# Patient Record
Sex: Female | Born: 1955 | Race: White | Hispanic: No | Marital: Married | Smoking: Former smoker
Health system: Southern US, Community
[De-identification: ages and names within clinical notes are randomized; demographics above are authoritative.]

## PROBLEM LIST (undated history)

## (undated) DIAGNOSIS — M546 Pain in thoracic spine: Secondary | ICD-10-CM

## (undated) DIAGNOSIS — D729 Disorder of white blood cells, unspecified: Secondary | ICD-10-CM

## (undated) DIAGNOSIS — R011 Cardiac murmur, unspecified: Secondary | ICD-10-CM

## (undated) DIAGNOSIS — E7800 Pure hypercholesterolemia, unspecified: Secondary | ICD-10-CM

## (undated) DIAGNOSIS — S2500XA Unspecified injury of thoracic aorta, initial encounter: Secondary | ICD-10-CM

## (undated) DIAGNOSIS — K760 Fatty (change of) liver, not elsewhere classified: Secondary | ICD-10-CM

## (undated) DIAGNOSIS — Z803 Family history of malignant neoplasm of breast: Secondary | ICD-10-CM

## (undated) DIAGNOSIS — D72829 Elevated white blood cell count, unspecified: Secondary | ICD-10-CM

## (undated) DIAGNOSIS — K5900 Constipation, unspecified: Secondary | ICD-10-CM

## (undated) DIAGNOSIS — D649 Anemia, unspecified: Secondary | ICD-10-CM

## (undated) DIAGNOSIS — F419 Anxiety disorder, unspecified: Secondary | ICD-10-CM

## (undated) DIAGNOSIS — R700 Elevated erythrocyte sedimentation rate: Secondary | ICD-10-CM

## (undated) DIAGNOSIS — K219 Gastro-esophageal reflux disease without esophagitis: Secondary | ICD-10-CM

## (undated) DIAGNOSIS — R899 Unspecified abnormal finding in specimens from other organs, systems and tissues: Secondary | ICD-10-CM

## (undated) DIAGNOSIS — I10 Essential (primary) hypertension: Secondary | ICD-10-CM

## (undated) DIAGNOSIS — M1990 Unspecified osteoarthritis, unspecified site: Secondary | ICD-10-CM

## (undated) HISTORY — DX: Unspecified abnormal finding in specimens from other organs, systems and tissues: R89.9

## (undated) HISTORY — DX: Unspecified osteoarthritis, unspecified site: M19.90

## (undated) HISTORY — DX: Constipation, unspecified: K59.00

## (undated) HISTORY — DX: Essential (primary) hypertension: I10

## (undated) HISTORY — DX: Gastro-esophageal reflux disease without esophagitis: K21.9

## (undated) HISTORY — DX: Cardiac murmur, unspecified: R01.1

## (undated) HISTORY — PX: CHOLECYSTECTOMY: SHX55

## (undated) HISTORY — DX: Unspecified injury of thoracic aorta, initial encounter: S25.00XA

## (undated) HISTORY — DX: Fatty (change of) liver, not elsewhere classified: K76.0

## (undated) HISTORY — DX: Elevated erythrocyte sedimentation rate: R70.0

## (undated) HISTORY — DX: Anemia, unspecified: D64.9

## (undated) HISTORY — DX: Anxiety disorder, unspecified: F41.9

## (undated) HISTORY — DX: Family history of malignant neoplasm of breast: Z80.3

## (undated) HISTORY — DX: Pain in thoracic spine: M54.6

## (undated) HISTORY — DX: Elevated white blood cell count, unspecified: D72.829

## (undated) HISTORY — DX: Disorder of white blood cells, unspecified: D72.9

## (undated) HISTORY — DX: Pure hypercholesterolemia, unspecified: E78.00

---

## 1998-10-10 HISTORY — PX: ABDOMINAL HYSTERECTOMY: SHX81

## 2002-11-20 ENCOUNTER — Encounter: Payer: Self-pay | Admitting: Gastroenterology

## 2002-11-20 ENCOUNTER — Encounter: Admission: RE | Admit: 2002-11-20 | Discharge: 2002-11-20 | Payer: Self-pay | Admitting: Gastroenterology

## 2011-08-11 ENCOUNTER — Inpatient Hospital Stay (HOSPITAL_COMMUNITY)
Admission: EM | Admit: 2011-08-11 | Discharge: 2011-08-14 | DRG: 547 | Disposition: A | Discharge status: Home / Self Care | Payer: PRIVATE HEALTH INSURANCE | Attending: Internal Medicine | Admitting: Internal Medicine

## 2011-08-11 ENCOUNTER — Ambulatory Visit
Admission: RE | Admit: 2011-08-11 | Discharge: 2011-08-11 | Disposition: A | Discharge status: Home / Self Care | Payer: PRIVATE HEALTH INSURANCE | Source: Ambulatory Visit | Attending: Family Medicine | Admitting: Family Medicine

## 2011-08-11 ENCOUNTER — Other Ambulatory Visit: Payer: Self-pay | Admitting: Family Medicine

## 2011-08-11 DIAGNOSIS — M4120 Other idiopathic scoliosis, site unspecified: Secondary | ICD-10-CM | POA: Diagnosis present

## 2011-08-11 DIAGNOSIS — R05 Cough: Secondary | ICD-10-CM

## 2011-08-11 DIAGNOSIS — R319 Hematuria, unspecified: Secondary | ICD-10-CM | POA: Diagnosis present

## 2011-08-11 DIAGNOSIS — I776 Arteritis, unspecified: Principal | ICD-10-CM | POA: Diagnosis present

## 2011-08-11 DIAGNOSIS — D649 Anemia, unspecified: Secondary | ICD-10-CM | POA: Diagnosis present

## 2011-08-11 DIAGNOSIS — K219 Gastro-esophageal reflux disease without esophagitis: Secondary | ICD-10-CM | POA: Diagnosis present

## 2011-08-11 DIAGNOSIS — I7101 Dissection of thoracic aorta: Secondary | ICD-10-CM

## 2011-08-11 DIAGNOSIS — E876 Hypokalemia: Secondary | ICD-10-CM | POA: Diagnosis present

## 2011-08-11 DIAGNOSIS — R911 Solitary pulmonary nodule: Secondary | ICD-10-CM | POA: Diagnosis present

## 2011-08-11 LAB — DIFFERENTIAL
Basophils Absolute: 0 10*3/uL (ref 0.0–0.1)
Basophils Relative: 0 % (ref 0–1)
Eosinophils Absolute: 0 10*3/uL (ref 0.0–0.7)
Eosinophils Relative: 0 % (ref 0–5)
Lymphocytes Relative: 11 % — ABNORMAL LOW (ref 12–46)
Lymphs Abs: 1.4 10*3/uL (ref 0.7–4.0)
Monocytes Absolute: 1.1 10*3/uL — ABNORMAL HIGH (ref 0.1–1.0)
Monocytes Relative: 8 % (ref 3–12)
Neutro Abs: 10.6 10*3/uL — ABNORMAL HIGH (ref 1.7–7.7)
Neutrophils Relative %: 80 % — ABNORMAL HIGH (ref 43–77)

## 2011-08-11 LAB — CBC
HCT: 27.9 % — ABNORMAL LOW (ref 36.0–46.0)
Hemoglobin: 9.6 g/dL — ABNORMAL LOW (ref 12.0–15.0)
MCH: 31.1 pg (ref 26.0–34.0)
MCHC: 34.4 g/dL (ref 30.0–36.0)
MCV: 90.3 fL (ref 78.0–100.0)
Platelets: 639 10*3/uL — ABNORMAL HIGH (ref 150–400)
RBC: 3.09 MIL/uL — ABNORMAL LOW (ref 3.87–5.11)
RDW: 11.8 % (ref 11.5–15.5)
WBC: 13.2 K/uL — ABNORMAL HIGH (ref 4.0–10.5)

## 2011-08-11 LAB — BASIC METABOLIC PANEL WITH GFR
Calcium: 9.5 mg/dL (ref 8.4–10.5)
Creatinine, Ser: 0.55 mg/dL (ref 0.50–1.10)
GFR calc Af Amer: >90 mL/min (ref >90)
GFR calc non Af Amer: >90 mL/min (ref >90)
Sodium: 136 meq/L (ref 135–145)

## 2011-08-11 LAB — POCT I-STAT, CHEM 8
BUN: 4 mg/dL — ABNORMAL LOW (ref 6–23)
Calcium, Ion: 1.06 mmol/L — ABNORMAL LOW (ref 1.12–1.32)
Chloride: 97 mEq/L (ref 96–112)
Creatinine, Ser: 0.7 mg/dL (ref 0.50–1.10)
Glucose, Bld: 130 mg/dL — ABNORMAL HIGH (ref 70–99)
HCT: 35 % — ABNORMAL LOW (ref 36.0–46.0)
Hemoglobin: 11.9 g/dL — ABNORMAL LOW (ref 12.0–15.0)
Potassium: 2.6 mEq/L — CL (ref 3.5–5.1)
Sodium: 137 mEq/L (ref 135–145)
TCO2: 27 mmol/L (ref 0–100)

## 2011-08-11 LAB — SAMPLE TO BLOOD BANK

## 2011-08-11 LAB — BASIC METABOLIC PANEL
BUN: 6 mg/dL (ref 6–23)
CO2: 28 mEq/L (ref 19–32)
Chloride: 94 mEq/L — ABNORMAL LOW (ref 96–112)
Glucose, Bld: 128 mg/dL — ABNORMAL HIGH (ref 70–99)
Potassium: 2.6 mEq/L — CL (ref 3.5–5.1)

## 2011-08-11 LAB — CK TOTAL AND CKMB (NOT AT ARMC)
CK, MB: 1.4 ng/mL (ref 0.3–4.0)
Total CK: 29 U/L (ref 7–177)

## 2011-08-11 LAB — PROTIME-INR
INR: 1.24 (ref 0.00–1.49)
Prothrombin Time: 15.9 s — ABNORMAL HIGH (ref 11.6–15.2)

## 2011-08-11 LAB — APTT: aPTT: 35 s (ref 24–37)

## 2011-08-11 MED ORDER — IOHEXOL 300 MG/ML  SOLN
125.0000 mL | Freq: Once | INTRAMUSCULAR | Status: AC | PRN
  Administered 2011-08-11: 125 mL via INTRAVENOUS

## 2011-08-12 LAB — IRON AND TIBC
Iron: 10 ug/dL — ABNORMAL LOW (ref 42–135)
UIBC: 169 ug/dL (ref 125–400)

## 2011-08-12 LAB — CARDIAC PANEL(CRET KIN+CKTOT+MB+TROPI)
CK, MB: 1.3 ng/mL (ref 0.3–4.0)
CK, MB: 1.2 ng/mL (ref 0.3–4.0)
Troponin I: <0.3 ng/mL (ref <0.30)

## 2011-08-12 LAB — FERRITIN: Ferritin: 1038 ng/mL — ABNORMAL HIGH (ref 10–291)

## 2011-08-12 LAB — MAGNESIUM: Magnesium: 2.2 mg/dL (ref 1.5–2.5)

## 2011-08-12 NOTE — H&P (Signed)
Alexandra Jordan, Alexandra Jordan NO.:  [id number]  MEDICAL RECORD NO.:  [id number]  LOCATION:  [facility]                         FACILITY:  [facility]  PHYSICIAN:  Houston Siren, MD           DATE OF BIRTH:  1955-10-23  DATE OF ADMISSION:  08/11/2011 DATE OF DISCHARGE:                             HISTORY & PHYSICAL   PRIMARY CARE PHYSICIAN:  Dr. Tenny Craw.  ADVANCED DIRECTIVES:  Full code.  REASON FOR ADMISSION:  Possible descending aortic dissection and anemia.  HISTORY OF PRESENT ILLNESS:  This is a 55 year old female with benign past medical history on no chronic medication, presents to the emergency room with chest pain and back pain.  She had back pain and chest pain for about a week and was diagnosed with musculoskeletal strain, treated with muscle relaxers and nonsteroidal anti-inflammatory drugs, but reported no improvement.  Her primary care physician ordered a CT pulmonary angiogram mainly looking for a PE, which was negative, but found to have a question of mural thrombus in the descending aorta along with a very small (0.6 cm x 0.6 cm) pulmonary nodule.  She was subsequently sent to the emergency room.  Workup is also significant for an anemia with a hemoglobin of 9.6.  It should be noted that she is status post hysterectomy, and she did have colonoscopy several years ago which reportedly was negative.  Her EKG showed normal sinus rhythm without any acute ST changes.  Consultation with Dr. Orpah Greek of vascular surgery who recommended observation admission and to treat her blood pressure more aggressively.  I asked him whether or not we should consider heparin and he recommended not to start her on any heparin.  She is currently comfortable, but is quite anxious.  She is not sure about her prior history of anemia and she could not elaborate.  REVIEW OF SYSTEMS:  Otherwise unremarkable.  CURRENT MEDICATIONS:  Tramadol, muscle relaxant, and Prilosec.  ALLERGIES:  CELEBREX,  causes hives.  FAMILY HISTORY:  No hematological problem but her mother had breast cancer.  SOCIAL HISTORY:  She previously worked for [employer].  She lives with her spouse and has 3 children.  Currently, she is a housewife.  She denied tobacco, alcohol, or drug use.  PHYSICAL EXAM:  VITAL SIGNS:  Blood pressure is 130/86, pulse of about 100, respirations 16, temperature 98.4. GENERAL:  She is noted to be in apparent distress. EYES:  Sclerae are nonicteric. NECK:  Supple.  Throat is clear.  No carotid bruit. CARDIAC:  S1, S2 regular.  I did not hear any murmur, rub, or gallop. LUNGS;  clear.  No wheezes, rales, or any evidence of consolidation. ABDOMEN:  Soft, nondistended, nontender. EXTREMITIES:  No edema.  She has good distal pulses bilaterally.  She has no abdominal bruit. SKIN:  Warm and dry.  No peripheral sign of shock. NEUROLOGIC:  Unremarkable. PSYCHIATRIC:  Unremarkable.  OBJECTIVE FINDINGS:  CT of the chest shows 0.6 cm pulmonary nodule and a density in the descending aorta suspicious for mural thrombus.  White count of 13.2000, hemoglobin of 9.6, MCV of 90, platelet count 639,000. INR of 1.24.  Serum sodium 136, potassium 2.6, creatinine 0.55,  glucose 128.  EKG showed normal sinus rhythm without any acute ST changes.  IMPRESSION:  This is a 55 year old female with possible descending aortic dissection.  We will admit her to telemetry.  We will rule out with CPKs and troponins.  Vascular surgeon Dr. Orpah Greek will see her first thing in the morning.  I will start her on beta blocker and give her pain medication. He recommended NO IV heparin.  For her anemia, we will do a full workup. This will include  reticulocyte count, iron studies, B12, stool guaiac,etc.  She also has low potassium and will get both IV runs along with daily supplements for probably a couple of months.  Her total K deficit is estimated at about 400 to 600 milliEqs total. We will  replete her potassium and  I would like to check a cortisol level along with a Mag level. She is stable, full code, will be admitted to team 5.     Houston Siren, MD     PL/MEDQ  D:  08/11/2011  T:  08/11/2011  Job:  [id number]  Electronically Signed by Houston Siren  on 08/12/2011 04:01:30 AM

## 2011-08-13 ENCOUNTER — Inpatient Hospital Stay (HOSPITAL_COMMUNITY): Payer: PRIVATE HEALTH INSURANCE

## 2011-08-13 ENCOUNTER — Encounter (HOSPITAL_COMMUNITY): Payer: Self-pay | Admitting: Radiology

## 2011-08-13 LAB — BASIC METABOLIC PANEL
Chloride: 102 mEq/L (ref 96–112)
Creatinine, Ser: 0.58 mg/dL (ref 0.50–1.10)
GFR calc Af Amer: >90 mL/min (ref >90)
GFR calc non Af Amer: >90 mL/min (ref >90)
Potassium: 3.8 mEq/L (ref 3.5–5.1)

## 2011-08-13 LAB — CBC
Hemoglobin: 9.8 g/dL — ABNORMAL LOW (ref 12.0–15.0)
MCH: 30.6 pg (ref 26.0–34.0)
MCHC: 33 g/dL (ref 30.0–36.0)
Platelets: 486 10*3/uL — ABNORMAL HIGH (ref 150–400)
RBC: 3.2 MIL/uL — ABNORMAL LOW (ref 3.87–5.11)

## 2011-08-13 LAB — MAGNESIUM: Magnesium: 2.3 mg/dL (ref 1.5–2.5)

## 2011-08-13 LAB — CORTISOL-AM, BLOOD: Cortisol - AM: 13.5 ug/dL (ref 4.3–22.4)

## 2011-08-13 LAB — OCCULT BLOOD X 1 CARD TO LAB, STOOL: Fecal Occult Bld: NEGATIVE

## 2011-08-13 MED ORDER — HYDROMORPHONE HCL PF 1 MG/ML IJ SOLN: 0.5000 mg | INTRAMUSCULAR | Status: DC | PRN

## 2011-08-13 MED ORDER — TRAMADOL HCL 50 MG PO TABS: 50.0000 mg | ORAL_TABLET | Freq: Two times a day (BID) | ORAL | Status: DC | PRN

## 2011-08-13 MED ORDER — SODIUM CHLORIDE 0.9 % IJ SOLN
3.0000 mL | Freq: Two times a day (BID) | INTRAMUSCULAR | Status: DC
  Administered 2011-08-13: 3 mL via INTRAVENOUS

## 2011-08-13 MED ORDER — ACETAMINOPHEN 325 MG PO TABS: 650.0000 mg | ORAL_TABLET | ORAL | Status: DC | PRN

## 2011-08-13 MED ORDER — IOHEXOL 350 MG/ML SOLN
80.0000 mL | Freq: Once | INTRAVENOUS | Status: AC | PRN
  Administered 2011-08-13: 80 mL via INTRAVENOUS

## 2011-08-13 MED ORDER — LORAZEPAM 1 MG PO TABS
1.0000 mg | ORAL_TABLET | Freq: Three times a day (TID) | ORAL | Status: DC
  Administered 2011-08-13: 1 mg via ORAL

## 2011-08-13 MED ORDER — METOPROLOL TARTRATE 25 MG PO TABS
25.0000 mg | ORAL_TABLET | Freq: Two times a day (BID) | ORAL | Status: DC
  Filled 2011-08-13 (×4): qty 1

## 2011-08-13 MED ORDER — PANTOPRAZOLE SODIUM 40 MG PO TBEC: 40.0000 mg | DELAYED_RELEASE_TABLET | Freq: Every day | ORAL | Status: DC

## 2011-08-13 MED ORDER — POTASSIUM CHLORIDE CRYS ER 20 MEQ PO TBCR
20.0000 meq | EXTENDED_RELEASE_TABLET | Freq: Every day | ORAL | Status: DC
  Administered 2011-08-14: 20 meq via ORAL

## 2011-08-13 MED ORDER — ASPIRIN EC 81 MG PO TBEC
81.0000 mg | DELAYED_RELEASE_TABLET | Freq: Every day | ORAL | Status: DC
  Administered 2011-08-14: 81 mg via ORAL
  Filled 2011-08-13: qty 1

## 2011-08-14 DIAGNOSIS — D649 Anemia, unspecified: Secondary | ICD-10-CM | POA: Diagnosis present

## 2011-08-14 DIAGNOSIS — E876 Hypokalemia: Secondary | ICD-10-CM | POA: Diagnosis present

## 2011-08-14 DIAGNOSIS — R319 Hematuria, unspecified: Secondary | ICD-10-CM | POA: Diagnosis present

## 2011-08-14 DIAGNOSIS — I776 Arteritis, unspecified: Secondary | ICD-10-CM | POA: Diagnosis present

## 2011-08-14 LAB — COMPREHENSIVE METABOLIC PANEL
ALT: 22 U/L (ref 0–35)
AST: 18 U/L (ref 0–37)
Albumin: 2.6 g/dL — ABNORMAL LOW (ref 3.5–5.2)
Alkaline Phosphatase: 110 U/L (ref 39–117)
CO2: 27 mEq/L (ref 19–32)
Chloride: 104 mEq/L (ref 96–112)
Creatinine, Ser: 0.67 mg/dL (ref 0.50–1.10)
GFR calc non Af Amer: >90 mL/min (ref >90)
Potassium: 4 mEq/L (ref 3.5–5.1)
Sodium: 140 mEq/L (ref 135–145)
Total Bilirubin: 0.2 mg/dL — ABNORMAL LOW (ref 0.3–1.2)

## 2011-08-14 LAB — DIFFERENTIAL
Basophils Absolute: 0 10*3/uL (ref 0.0–0.1)
Basophils Relative: 0 % (ref 0–1)
Lymphocytes Relative: 16 % (ref 12–46)
Monocytes Absolute: 0.8 10*3/uL (ref 0.1–1.0)
Neutro Abs: 6.6 10*3/uL (ref 1.7–7.7)
Neutrophils Relative %: 73 % (ref 43–77)

## 2011-08-14 LAB — URINALYSIS, ROUTINE W REFLEX MICROSCOPIC
Bilirubin Urine: NEGATIVE
Glucose, UA: NEGATIVE mg/dL
Ketones, ur: NEGATIVE mg/dL
Protein, ur: NEGATIVE mg/dL
Urobilinogen, UA: 0.2 mg/dL (ref 0.0–1.0)

## 2011-08-14 LAB — CBC
HCT: 28.8 % — ABNORMAL LOW (ref 36.0–46.0)
MCHC: 32.3 g/dL (ref 30.0–36.0)
RDW: 12 % (ref 11.5–15.5)
WBC: 9.1 10*3/uL (ref 4.0–10.5)

## 2011-08-14 LAB — URINE MICROSCOPIC-ADD ON

## 2011-08-14 MED ORDER — ASPIRIN 81 MG PO TBEC: 81.0000 mg | DELAYED_RELEASE_TABLET | Freq: Every day | ORAL | Status: AC

## 2011-08-14 NOTE — Progress Notes (Signed)
Pt. And husband given d/c information...reviewed with both. Both verbalized understanding. No Rx's to be given. Pt. Escorted to private vehicle via W/C.

## 2011-08-14 NOTE — Discharge Summary (Signed)
Patient ID: Alexandra Jordan MRN: [id number] DOB/AGE: 10-Nov-1955 55 y.o.  Admit date: 08/11/2011 Discharge date: 08/14/2011  Primary Care Physician:  Daisy Floro, MD   Discharge Diagnoses:    Present on Admission:  .Aortitis/vasculitis  .Hypokalemia .Anemia .Lung nodule .Hematuria  Current Discharge Medication List  START taking these medications  Details aspirin EC 81 MG EC tablet Take 1 tablet (81 mg total) by mouth daily. Qty: 30 tablet, Refills: 0   CONTINUE these medications which have NOT CHANGED  Details metaxalone (SKELAXIN) 800 MG tablet Take 800 mg by mouth 3 (three) times daily.    omeprazole (PRILOSEC) 20 MG capsule Take 20 mg by mouth daily.    traMADol (ULTRAM) 50 MG tablet Take 50-100 mg by mouth every 12 (twelve) hours as needed. Maximum dose= 8 tablets per day  ** For pain**       Consults: Vascular surgery   Significant Diagnostic Studies:  CTA chest 08/11/11  *RADIOLOGY REPORT*  IMPRESSION:  1. Prominent abnormal soft tissue density around the mid  descending thoracic aorta is concerning for intramural hematoma in  this patient with chest pain. Emergent vascular surgical  consultation is recommended.  2. 6 mm right upper lobe pulmonary nodule. If the patient is at  high risk for bronchogenic carcinoma, follow-up chest CT at 6-12  months is recommended. If the patient is at low risk for  bronchogenic carcinoma, follow-up chest CT at 12 months is  recommended. This recommendation follows the consensus statement:  Guidelines for Management of Small Pulmonary Nodules Detected on CT  Scans: A Statement from the Fleischner Society as published in  Radiology 2005; 237:395-400. Online at:  DietDisorder.cz.     CTA chest 08/13/11 IMPRESSION:  stable descending thoracic aortic hypodense wall thickening without  associated aneurysm or dissection. Appearance is consistent with  previous intramural  hematoma and other considerations would include  aortitis/vasculitis.  Stable 6 mm right upper lobe pulmonary nodule as previously  described.  Basilar atelectasis and small left pleural effusion     CTA abdomen/pelvis11/3/12 IMPRESSION:  Proximal abdominal aortic wall thickening to a lesser degree.  There is also associated soft tissue thickening around the celiac  and SMA origins. Findings remain nonspecific but appearance is  more consistent with aortitis/vasculitis rather than acute  intramural hemorrhage. No associated dissection, aneurysm,  pseudoaneurysm or retroperitoneal acute hemorrhage.  No aortoiliac occlusive process.  Iliac vessels and proximal femoral vessels are not involved.  Previous cholecystectomy  No acute intra-abdominal or pelvic process.  Original Report Authenticated By: Judie Petit. Ruel Favors, M.D.    Brief H and P:  This is a 55 year old female with benign past medical history on no chronic medication, presents to the emergency room with chest pain and back pain. She had back pain and chest pain  for about a week and was diagnosed with musculoskeletal strain, treated  with muscle relaxers and nonsteroidal anti-inflammatory drugs, but reported no improvement. Her primary care physician ordered a CT pulmonary  angiogram mainly looking for a PE, which was negative, but found to have a  question of mural thrombus in the descending aorta along with a very  small (0.6 cm x 0.6 cm) pulmonary nodule. She was subsequently sent to  the emergency room. Workup is also significant for an anemia with a  hemoglobin of 9.6. It should be noted that she is status post  hysterectomy, and she did have colonoscopy several years ago which  reportedly was negative. Her EKG showed normal sinus rhythm without any  acute ST changes. Consultation with Dr. Orpah Greek of vascular surgery who  recommended observation admission and to treat her blood pressure more  aggressively. I asked him  whether or not we should consider heparin and he  recommended not to start her on any heparin. She is currently  comfortable, but is quite anxious. She is not sure about her prior history of anemia and she could not elaborate.     Hospital Course:  Principal Problem:  *Aortitis/VASCULITIS: Because of concern for possible aortic dissection/aneurysm on admission ,patient was admitted to telemetry floor and started on metoprolol ,vascular surgery  Service was consulted . Patient was seen by  Dr Myra Gianotti from vascular surgery  Who recommended to repeat CTA chest/abdomen the next day and started patient on aspirin  ,results as above showed no aortic dissection or aneurysm ,however showed finding suggestive of possible vasculitis or aortitis as above .Patient was seen by Dr Imogene Burn on follow up who recommended to follow up with Dr Myra Gianotti as outpatient ,he also agreed to discontinue metoprolol and continue with ASA .Etiology of vasculitis/aortitis is unclear ,I sent ANA which still pending . Patient had a low grade temperature last night , that was discussed with Dr Imogene Burn who stated that it is not related to this condition . Active Problems:  Hypokalemia Repleted,magnesium level was normal   Anemia Stool for occult blood was negative ,patient will need anemia work up as outpatient ,will defer to PCP.  Hematuria Mild ,incidentlly found on urinalysis ,patient is asymptomatic ,defer further work up to PCP as outpatient if persists .urine culture pending . Lung nodule  Will need serial imaging as outpatient ,follow with PCP.  Low grade temperature: Highest temp was 100, UA showed trace leukocytes and moderate blood ,0-2 WBC ,patient is symptomatic,urine culture was sent ,results still pending ,no antibiotics given.Blood culture was also sent last night ,reports still pending ,sepsis is very unlikely.  Patient seen and examined by me today ,she is currently a symptomatic ,all her questions and concerns  addressed to the best of my knowledge and also discussed with Dr Imogene Burn who stated that there is no intervention needed at this time and cleared the patient for discharge to follow with Dr Myra Gianotti in 2 -4 weeks.   Filed Vitals:  08/14/11 0615 BP: 100/69 Pulse: 72 Temp: 98.2 F (36.8 C) Resp: 20   General: Alert, awake, oriented x3, in no acute distress. Heart: Regular rate and rhythm, without murmurs, rubs, gallops. Lungs: Clear to auscultation bilaterally. Abdomen: Soft, nontender, nondistended, positive bowel sounds. Extremities: No clubbing cyanosis or edema with positive pedal pulses.   Disposition and Follow-up: Follow with PCP in 1 week  Follow with Dr Myra Gianotti in 2 weeks   Pending labs at the time of discharge: PCP/VVS  please follow  Urine culture Blood culture  ANA    Time spent on Discharge: 60 minutes    Signed: Lorin Hauck 08/14/2011, 8:28 AM

## 2011-08-15 ENCOUNTER — Other Ambulatory Visit: Payer: Self-pay | Admitting: *Deleted

## 2011-08-15 DIAGNOSIS — I7101 Dissection of thoracic aorta: Secondary | ICD-10-CM

## 2011-08-15 LAB — URINE CULTURE

## 2011-08-15 LAB — ANTI-NUCLEAR AB-TITER (ANA TITER)

## 2011-08-15 NOTE — Consult Note (Signed)
Alexandra Jordan, Alexandra Jordan           ACCOUNT NO.:  [id number]  MEDICAL RECORD NO.:  [id number]  LOCATION:                                 FACILITY:  PHYSICIAN:  Juleen China IV, MDDATE OF BIRTH:  Nov 13, 1955  DATE OF CONSULTATION:  08/12/2011 DATE OF DISCHARGE:                                CONSULTATION   REASON FOR CONSULT:  Intramural hematoma, thoracic aorta.  HISTORY:  This is a 55 year old female with no significant past medical history who presented to the emergency department with back pain that had been going on for approximately 1 week.  This has been worked up as an outpatient and was thought to be secondary to musculoskeletal disease.  She was treated with muscle relaxation and nonsteroidal anti- inflammatory drugs but did not improve.  She ultimately underwent a CT scan looking for PE which was negative but was found to have a prominent soft tissue density around the mid descending thoracic aorta concerning for intramural hematoma.  She was admitted by the Medicine Service for overnight observation.  On my evaluation, she states that she is currently without symptoms.  She does not have a history of hypertension.  REVIEW OF SYSTEMS:  Otherwise is unremarkable.  MEDICATIONS:  Tramadol, muscle relaxation, and Prilosec.  ALLERGIES:  CELEBREX (hives).  FAMILY HISTORY:  Positive for breast cancer.  SOCIAL HISTORY:  No tobacco.  No alcohol.  PHYSICAL EXAMINATION:  VITAL SIGNS:  Blood pressure is 130/86, heart rate in the 90s, respirations 16, temp is 98.4. GENERAL:  She is well appearing, in no acute distress. HEENT:  Within normal limits. CARDIOVASCULAR:  Regular rate and rhythm. CHEST:  Respirations are nonlabored. ABDOMEN:  Soft, nontender. EXTREMITIES:  Warm, well perfused with good distal pulses and no edema. SKIN:  Without rash. NEUROLOGIC:  Without focal deficits.  DIAGNOSTIC STUDIES:  CT angiogram shows a 0.6-cm pulmonary nodule and this what appears  to be an intramural hematoma in the descending thoracic aorta.  ASSESSMENT AND PLAN:  Possible descending thoracic aorta intramural hematoma.  PLAN:  I have recommended continued observation, and blood pressure control with the addition of a beta-blocker.  I discussed the findings of the CT scan with the patient.  I think that this needs to be re- evaluated over the weekend with a repeat CT scan to make sure there has been no change.  Assuming that there has been no change, she can be discharged to home.  I will plan on seeing her back in the office in 1 month with a repeat CT angiogram.     Juleen China IV, MD     VWB/MEDQ  D:  08/14/2011  T:  08/14/2011  Job:  [id number]

## 2011-08-16 LAB — HEMOGLOBINOPATHY EVALUATION
Hemoglobin Other: 0 %
Hgb A: 97.6 % (ref 96.8–97.8)
Hgb S Quant: 0 %

## 2011-08-20 LAB — CULTURE, BLOOD (ROUTINE X 2)
Culture  Setup Time: 201211040108
Culture: NO GROWTH

## 2011-09-06 ENCOUNTER — Encounter: Payer: Self-pay | Admitting: Surgery

## 2011-09-16 ENCOUNTER — Encounter: Payer: Self-pay | Admitting: Surgery

## 2011-09-19 ENCOUNTER — Encounter: Payer: Self-pay | Admitting: Surgery

## 2011-09-19 ENCOUNTER — Ambulatory Visit (INDEPENDENT_AMBULATORY_CARE_PROVIDER_SITE_OTHER): Payer: PRIVATE HEALTH INSURANCE | Admitting: Surgery

## 2011-09-19 ENCOUNTER — Ambulatory Visit
Admission: RE | Admit: 2011-09-19 | Discharge: 2011-09-19 | Disposition: A | Discharge status: Home / Self Care | Payer: PRIVATE HEALTH INSURANCE | Source: Ambulatory Visit | Attending: Surgery | Admitting: Surgery

## 2011-09-19 VITALS — BP 155/95 | HR 84 | Resp 16 | Ht 65.0 in | Wt 127.0 lb

## 2011-09-19 DIAGNOSIS — M546 Pain in thoracic spine: Secondary | ICD-10-CM

## 2011-09-19 DIAGNOSIS — I7101 Dissection of thoracic aorta: Secondary | ICD-10-CM

## 2011-09-19 MED ORDER — IOHEXOL 300 MG/ML  SOLN
100.0000 mL | Freq: Once | INTRAMUSCULAR | Status: AC | PRN
  Administered 2011-09-19: 100 mL via INTRAVENOUS

## 2011-09-19 NOTE — Progress Notes (Signed)
[facility] [place]   Patient name: Alexandra Jordan MRN: [id number] DOB: 20-Nov-1955 Sex: female     Chief Complaint  Patient presents with  . Follow-up    Hospital consult on 08/12/11 for possible intramural hematoma, thoracic aorta. Pt had CTA today.    HISTORY OF PRESENT ILLNESS: Patient is back today for followup. I saw her in consultation in the hospital on 08/12/2011. At that time she's undergone a workup for back pain which ultimately led to a CT scan the CT scan showed a soft tissue density around the mid descending thoracic aorta which was concerning for intramural hematoma. She was admitted for observation. I repeat her CAT scan which did not show any interval change she was subsequently discharged to home with near resolution of her pain. She denies having any significant blood pressure problems. She denies a history of autoimmune disease or vascular disease. She is back today with no new complaints. She's undergone a full workup for vasculitis. All the blood results are still pending.  Past Medical History  Diagnosis Date  . Thoracic aorta injury     Intramural hematoma  . GERD (gastroesophageal reflux disease)   . Family history of breast cancer   . Acute thoracic back pain   . Anemia     Past Surgical History  Procedure Date  . Abdominal hysterectomy 2000    History   Social History  . Marital Status: Married    Spouse Name: N/A    Number of Children: N/A  . Years of Education: N/A   Occupational History  . Not on file.   Social History Main Topics  . Smoking status: Former Smoker -- 1 years    Types: Cigarettes    Quit date: 10/20/1979  . Smokeless tobacco: Not on file  . Alcohol Use: 0.6 oz/week    1 Glasses of wine per week  . Drug Use: No  . Sexually Active: Yes   Other Topics Concern  . Not on file   Social History Narrative  . No narrative on file    Family History  Problem Relation Age of Onset  . Cancer Other       Family History of Breast cancer  . Cancer Mother   . Hyperlipidemia Father   . Hypertension Father   . Deep vein thrombosis Sister     Allergies as of 09/19/2011 - Review Complete 09/19/2011  Allergen Reaction Noted  . Celecoxib Hives 08/12/2011    Current Outpatient Prescriptions on File Prior to Visit  Medication Sig Dispense Refill  . aspirin EC 81 MG EC tablet Take 1 tablet (81 mg total) by mouth daily.  30 tablet  0  . omeprazole (PRILOSEC) 20 MG capsule Take 20 mg by mouth daily.        . metaxalone (SKELAXIN) 800 MG tablet Take 800 mg by mouth 3 (three) times daily.        . traMADol (ULTRAM) 50 MG tablet Take 50-100 mg by mouth every 12 (twelve) hours as needed. Maximum dose= 8 tablets per day  ** For pain**        No current facility-administered medications on file prior to visit.     REVIEW OF SYSTEMS: Cardiovascular: No chest pain, chest pressure, palpitations, orthopnea, or dyspnea on exertion. No claudication or rest pain,  No history of DVT or phlebitis. Pulmonary: No productive cough, asthma or wheezing. Neurologic: No weakness, paresthesias, aphasia, or amaurosis. No dizziness. Hematologic: No bleeding problems or clotting  disorders. Musculoskeletal: No joint pain or joint swelling. Gastrointestinal: No blood in stool or hematemesis Genitourinary: No dysuria or hematuria. Psychiatric:: No history of major depression. Integumentary: No rashes or ulcers. Constitutional: No fever or chills.  PHYSICAL EXAMINATION:   Vital signs are BP 155/95  Pulse 84  Resp 16  Ht 5\' 5"  (1.651 m)  Wt 127 lb (57.607 kg)  BMI 21.13 kg/m2  SpO2 99% General: The patient appears their stated age. HEENT:  No gross abnormalities Pulmonary:  Non labored breathing Musculoskeletal: There are no major deformities. Neurologic: No focal weakness or paresthesias are detected, Skin: There are no ulcer or rashes noted. Psychiatric: The patient has normal affect. Cardiovascular:  There is a regular rate and rhythm without significant murmur appreciated. Palpable radial pulses bilaterally. No carotid bruits.   Diagnostic Studies CT angiogram was performed today this shows resolution of the thickened descending thoracic aortic wall.  Assessment: Resolved irregular area within the descending thoracic aorta Plan: This process most likely represented an intramural hematoma. It has now completely resolved. She is symptom-free. She is currently undergoing a vasculitis workup. I'm not going to schedule followup with me. I think this has a low risk of recurrence.  She does have a 0. 6 mm pulmonary nodule that his been stable she'll need a repeat CT scan of this area and one year. I will defer to her primary care physician to follow this.  Jorge Ny, M.D. [facility] Office: [phone] Pager:  [phone]

## 2013-01-08 ENCOUNTER — Other Ambulatory Visit: Payer: Self-pay | Admitting: Family Medicine

## 2013-01-08 DIAGNOSIS — R9389 Abnormal findings on diagnostic imaging of other specified body structures: Secondary | ICD-10-CM

## 2013-01-11 ENCOUNTER — Other Ambulatory Visit: Payer: PRIVATE HEALTH INSURANCE

## 2013-01-18 ENCOUNTER — Other Ambulatory Visit: Payer: PRIVATE HEALTH INSURANCE

## 2013-02-01 ENCOUNTER — Other Ambulatory Visit: Payer: PRIVATE HEALTH INSURANCE

## 2013-05-28 IMAGING — CT CT ANGIO CHEST
3 of 6 series · 12 of 30 positions shown · IV contrast ([ID] OMNI 300)
Comparison: Chest radiograph from [facility] [REDACTED] dated
08/11/2011

CLINICAL DATA: Cough.  Chest pain.  Possible new lung nodule.

CT ANGIOGRAPHY CHEST WITH CONTRAST
TECHNIQUE: Multidetector CT imaging of the chest was performed
using the standard protocol during bolus administration of
intravenous contrast.  Multiplanar CT image reconstructions
including MIPs were obtained to evaluate the vascular anatomy.
Contrast: 125mL OMNIPAQUE IOHEXOL 300 MG/ML IV SOLN

[Series 4: pe study (id) · axial · 0.70mm/px · z∈[-177,-32]mm · 4 of 118 slices shown]
[im 30/118  lung]
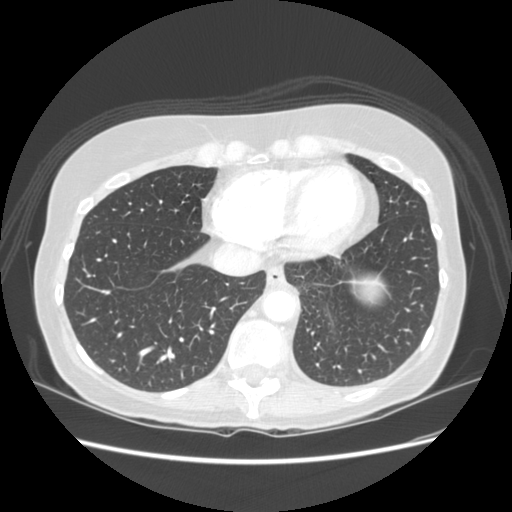
[im 59/118  lung]
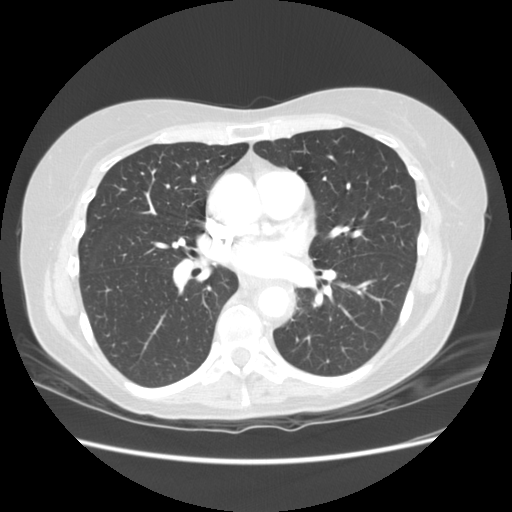
[im 63/118  lung]
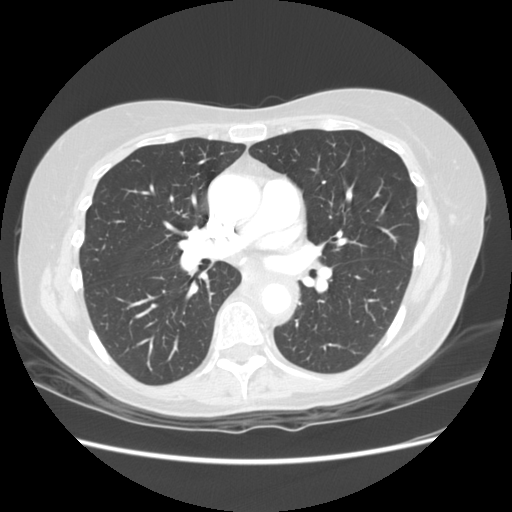
[im 88/118  lung]
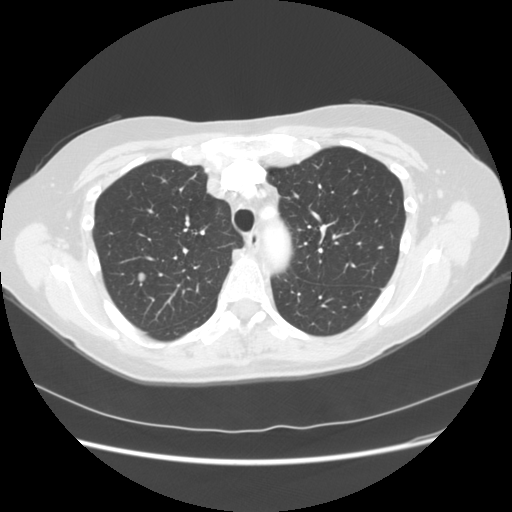

[Series 6: pe thin 1.25 · axial · 0.70mm/px · z∈[-201,-6]mm · 6 of 235 slices shown]
[im 40/235  lung]
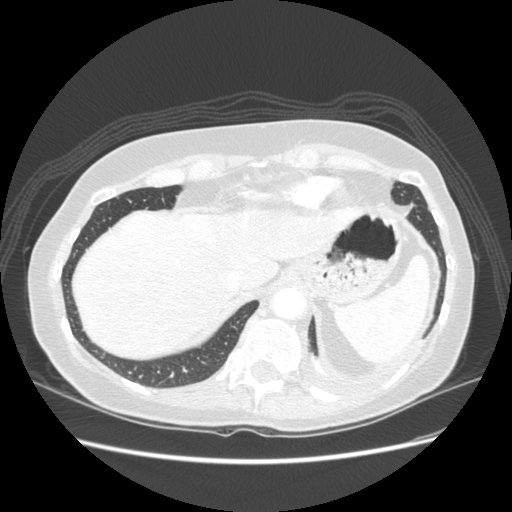
[im 79/235  mediastinal]
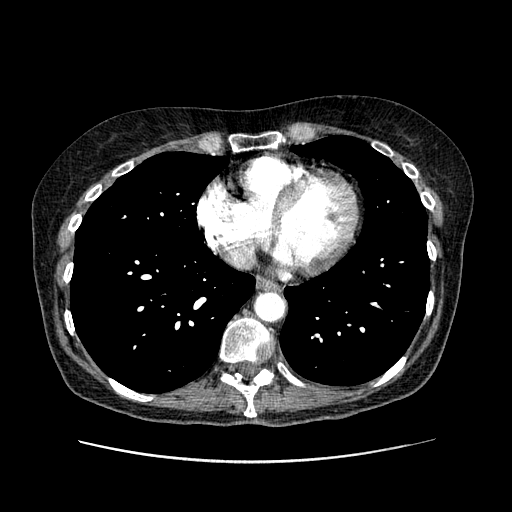
[im 118/235  lung]
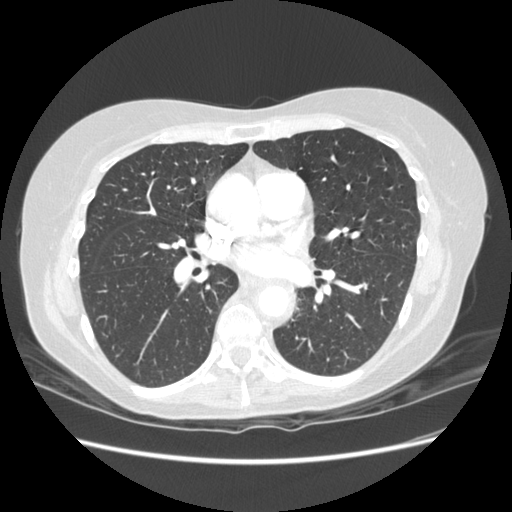
[im 124/235  mediastinal]
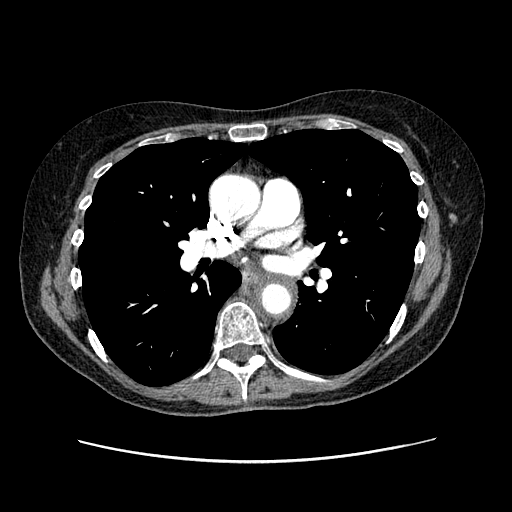
[im 157/235  lung]
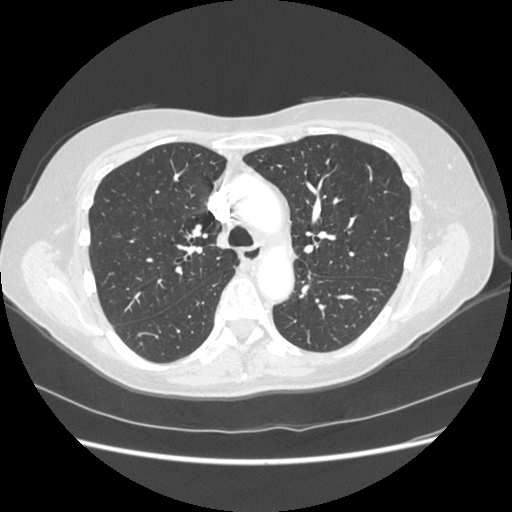
[im 196/235  mediastinal]
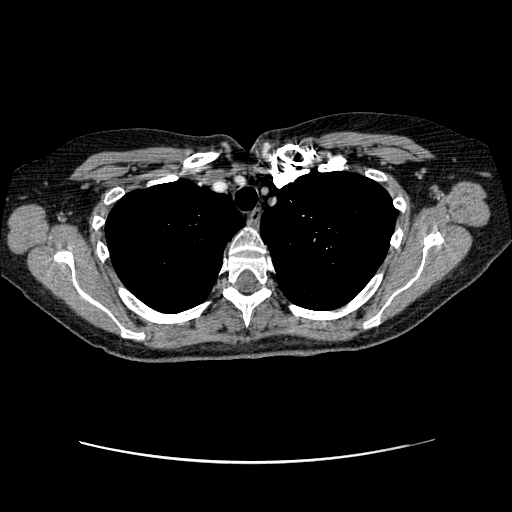

[Series 602: sagittals · sagittal · 0.70mm/px · 2 of 134 slices shown]
[im 45/134  lung]
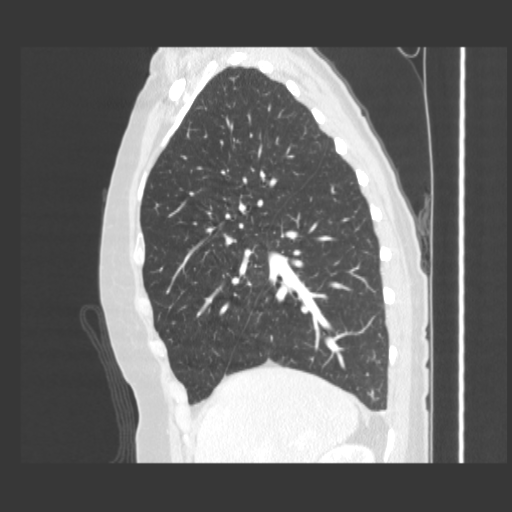
[im 89/134  lung]
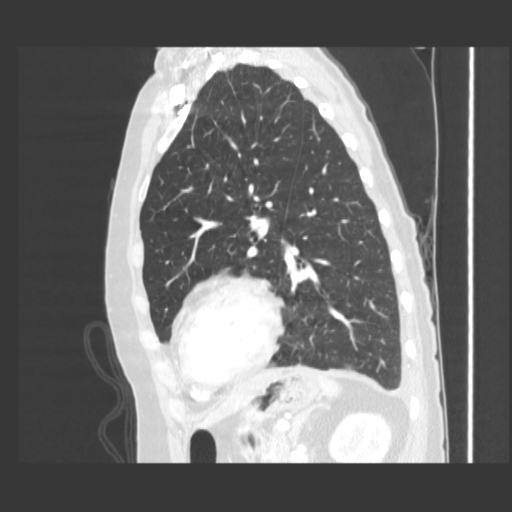

[12 of 30 positions shown; findings below may reference images not displayed]

FINDINGS: No filling defect is identified in the pulmonary
arterial tree to suggest pulmonary embolus.

There is abnormal soft tissue density surrounding the descending
thoracic aorta most prominent in the mid aorta, suspicious for
intramural hematoma, or possibly inflammation such as aortitis.  I
do not observe a definite dissection flap, but the appearance is
highly abnormal.  There is a trace left pleural effusion and an
ectatic ascending aorta.

Heterogeneous hepatic and splenic uptake is likely due to the early
phase of contrast reaction.  No pathologic thoracic adenopathy is
observed.

The right upper lobe nodule observed on chest radiography measures
0.6 x 0.6 cm on image 30 of series 5 and appears noncalcified.

Review of the MIP images confirms the above findings.
IMPRESSION: 1.  Prominent abnormal soft tissue density around the mid
descending thoracic aorta is concerning for intramural hematoma in
this patient with chest pain.  Emergent vascular surgical
consultation is recommended.
2.  6 mm right upper lobe pulmonary nodule. If the patient is at
high risk for bronchogenic carcinoma, follow-up chest CT at 6-12
months is recommended.  If the patient is at low risk for
bronchogenic carcinoma, follow-up chest CT at 12 months is
recommended.  This recommendation follows the consensus statement:
Guidelines for Management of Small Pulmonary Nodules Detected on CT
Scans: A Statement from the [HOSPITAL] as published in
[URL]

## 2013-07-06 IMAGING — CT CT ANGIO CHEST
2 of 6 series · 8 of 30 positions shown · IV contrast ([ID] OMNI 300)
Comparison: CT angio chest of 08/13/2011

CLINICAL DATA: Follow up of abnormality of the descending thoracic
aorta

CT ANGIOGRAPHY CHEST WITH CONTRAST
TECHNIQUE: Multidetector CT imaging of the chest was performed
using the standard protocol during bolus administration of
intravenous contrast.  Multiplanar CT image reconstructions
including MIPs were obtained to evaluate the vascular anatomy.
Contrast: 100mL OMNIPAQUE IOHEXOL 300 MG/ML IV SOLN

[Series 5: angio · axial · 0.70mm/px · z∈[-206,-51]mm · 3 of 125 slices shown]
[im 32/125  lung]
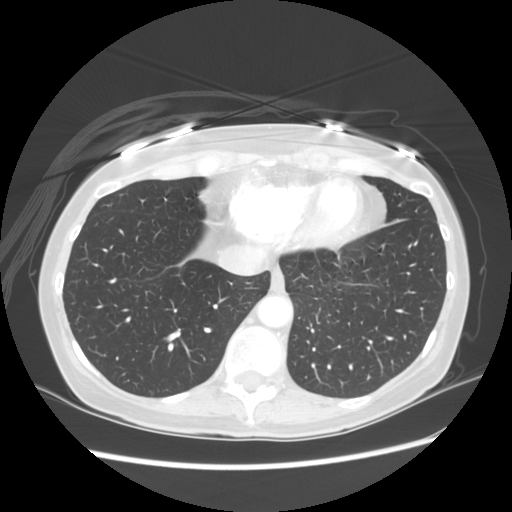
[im 63/125  mediastinal]
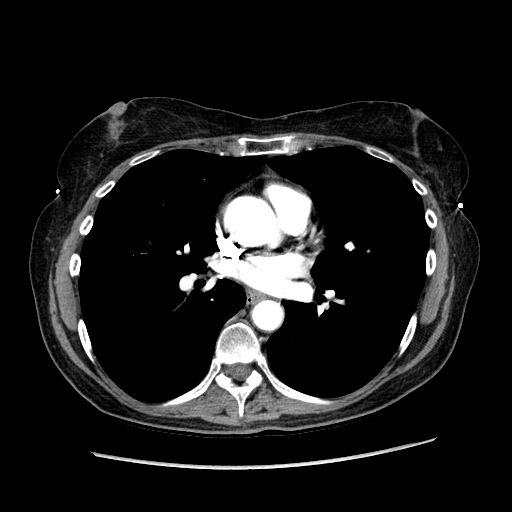
[im 94/125  lung]
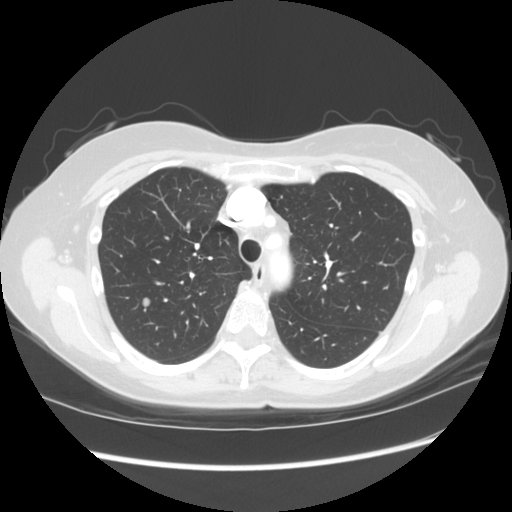

[Series 602: sagittal body · sagittal · 0.70mm/px · 5 of 145 slices shown]
[im 25/145  lung]
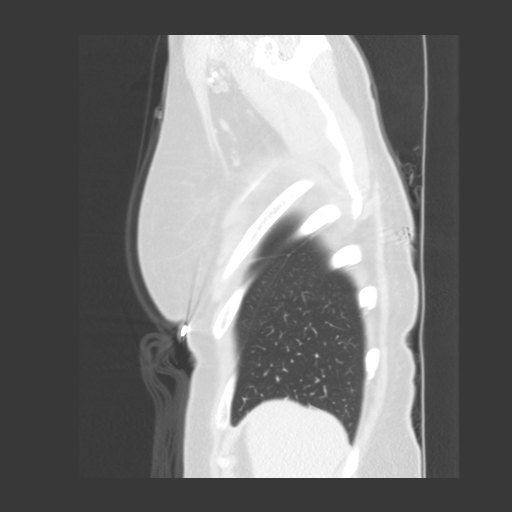
[im 49/145  lung]
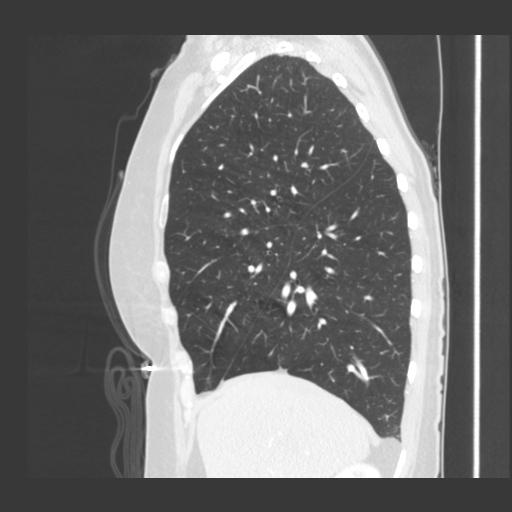
[im 73/145  lung]
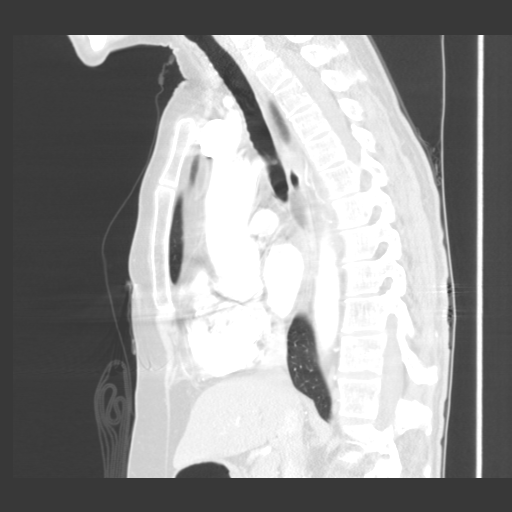
[im 97/145  lung]
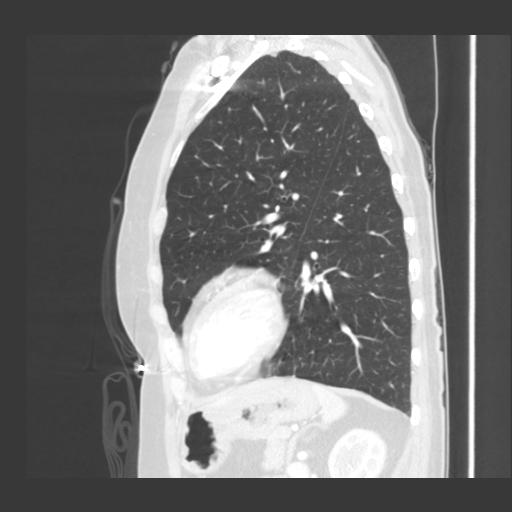
[im 121/145  lung]
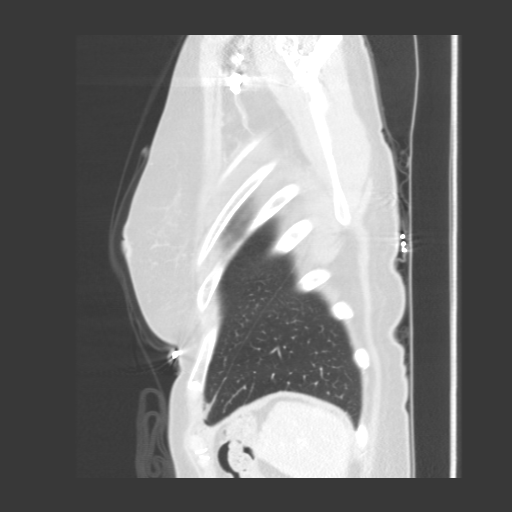

[8 of 30 positions shown; findings below may reference images not displayed]

FINDINGS: On soft tissue window images, the previously noted
circumferential mural thickening of the mid proximal descending
thoracic aorta has resolved.  This may have represented intramural
hematoma.  The ascending aorta is normal in caliber measuring
cm.  The origins of the great vessels are patent.  The aortic arch
measures 2.3 cm in its midportion with the mid descending thoracic
aorta measuring 2.4 cm.  The distal thoracic aorta just above the
diaphragmatic hiatus measures 2.2 cm in diameter.  There is no
evidence of dissection and no ulcerated plaque is seen.

No mediastinal or hilar adenopathy is seen.  No significant
coronary artery calcification is noted, and the heart is within
normal limits in size.  The portion of the upper abdomen that is
visualized on this exam is unremarkable.

On the lung window images the previously noted noncalcified 6-mm
right upper lobe lung nodule appears stable and continued follow-up
is recommended per guidelines given below. If the patient is at
high risk for bronchogenic carcinoma, follow-up chest CT at 6-12
months is recommended.  If the patient is at low risk for
bronchogenic carcinoma, follow-up chest CT at 12 months is
recommended.  This recommendation follows the consensus statement:
Guidelines for Management of Small Pulmonary Nodules Detected on CT
Scans: A Statement from the [HOSPITAL] as published in
[URL]

Review of the MIP images confirms the above findings.
IMPRESSION: 1.  Resolution of the circumferentially thickened proximal
descending thoracic aortic wall.  The wall is now normal in
caliber.  This may represent resolution of prior intramural
hematoma.
2.  Stable 6 mm noncalcified right upper lobe lung nodule.  Follow-
up guidelines are given above.

## 2017-06-13 ENCOUNTER — Inpatient Hospital Stay (HOSPITAL_BASED_OUTPATIENT_CLINIC_OR_DEPARTMENT_OTHER)
Admission: EM | Admit: 2017-06-13 | Discharge: 2017-06-16 | DRG: 392 | Disposition: A | Discharge status: Home / Self Care | Payer: 59 | Attending: Internal Medicine | Admitting: Internal Medicine

## 2017-06-13 ENCOUNTER — Emergency Department (HOSPITAL_BASED_OUTPATIENT_CLINIC_OR_DEPARTMENT_OTHER): Payer: 59

## 2017-06-13 ENCOUNTER — Encounter (HOSPITAL_BASED_OUTPATIENT_CLINIC_OR_DEPARTMENT_OTHER): Payer: Self-pay | Admitting: Emergency Medicine

## 2017-06-13 DIAGNOSIS — A419 Sepsis, unspecified organism: Secondary | ICD-10-CM

## 2017-06-13 DIAGNOSIS — Z888 Allergy status to other drugs, medicaments and biological substances status: Secondary | ICD-10-CM

## 2017-06-13 DIAGNOSIS — Z7982 Long term (current) use of aspirin: Secondary | ICD-10-CM

## 2017-06-13 DIAGNOSIS — R109 Unspecified abdominal pain: Secondary | ICD-10-CM

## 2017-06-13 DIAGNOSIS — E876 Hypokalemia: Secondary | ICD-10-CM | POA: Diagnosis not present

## 2017-06-13 DIAGNOSIS — E871 Hypo-osmolality and hyponatremia: Secondary | ICD-10-CM | POA: Diagnosis present

## 2017-06-13 DIAGNOSIS — R918 Other nonspecific abnormal finding of lung field: Secondary | ICD-10-CM | POA: Diagnosis present

## 2017-06-13 DIAGNOSIS — F419 Anxiety disorder, unspecified: Secondary | ICD-10-CM | POA: Diagnosis present

## 2017-06-13 DIAGNOSIS — K2900 Acute gastritis without bleeding: Secondary | ICD-10-CM | POA: Diagnosis not present

## 2017-06-13 DIAGNOSIS — K219 Gastro-esophageal reflux disease without esophagitis: Secondary | ICD-10-CM | POA: Diagnosis present

## 2017-06-13 DIAGNOSIS — Z87891 Personal history of nicotine dependence: Secondary | ICD-10-CM

## 2017-06-13 DIAGNOSIS — R6510 Systemic inflammatory response syndrome (SIRS) of non-infectious origin without acute organ dysfunction: Secondary | ICD-10-CM

## 2017-06-13 DIAGNOSIS — Z79899 Other long term (current) drug therapy: Secondary | ICD-10-CM

## 2017-06-13 LAB — CBC WITH DIFFERENTIAL/PLATELET
BASOS ABS: 0 10*3/uL (ref 0.0–0.1)
BASOS PCT: 0 %
Eosinophils Absolute: 0 10*3/uL (ref 0.0–0.7)
Eosinophils Relative: 0 %
HCT: 38.6 % (ref 36.0–46.0)
HEMOGLOBIN: 13.4 g/dL (ref 12.0–15.0)
Lymphocytes Relative: 12 %
Lymphs Abs: 1.9 10*3/uL (ref 0.7–4.0)
MCH: 31.7 pg (ref 26.0–34.0)
MCHC: 34.7 g/dL (ref 30.0–36.0)
MCV: 91.3 fL (ref 78.0–100.0)
Monocytes Absolute: 0.8 10*3/uL (ref 0.1–1.0)
Monocytes Relative: 5 %
NEUTROS PCT: 83 %
Neutro Abs: 12.9 10*3/uL — ABNORMAL HIGH (ref 1.7–7.7)
Platelets: 258 10*3/uL (ref 150–400)
RBC: 4.23 MIL/uL (ref 3.87–5.11)
RDW: 12.5 % (ref 11.5–15.5)
WBC: 15.7 10*3/uL — AB (ref 4.0–10.5)

## 2017-06-13 LAB — COMPREHENSIVE METABOLIC PANEL
ALBUMIN: 4.5 g/dL (ref 3.5–5.0)
ALK PHOS: 93 U/L (ref 38–126)
ALT: 31 U/L (ref 14–54)
ANION GAP: 15 (ref 5–15)
AST: 44 U/L — AB (ref 15–41)
CALCIUM: 8.9 mg/dL (ref 8.9–10.3)
CO2: 21 mmol/L — AB (ref 22–32)
CREATININE: 0.64 mg/dL (ref 0.44–1.00)
Chloride: 90 mmol/L — ABNORMAL LOW (ref 101–111)
GFR calc Af Amer: >60 mL/min (ref >60)
GFR calc non Af Amer: >60 mL/min (ref >60)
GLUCOSE: 110 mg/dL — AB (ref 65–99)
Potassium: 3.3 mmol/L — ABNORMAL LOW (ref 3.5–5.1)
SODIUM: 126 mmol/L — AB (ref 135–145)
Total Bilirubin: 1.1 mg/dL (ref 0.3–1.2)
Total Protein: 8.2 g/dL — ABNORMAL HIGH (ref 6.5–8.1)

## 2017-06-13 LAB — URINALYSIS, ROUTINE W REFLEX MICROSCOPIC
Bilirubin Urine: NEGATIVE
GLUCOSE, UA: NEGATIVE mg/dL
Ketones, ur: 40 mg/dL — AB
LEUKOCYTES UA: NEGATIVE
Nitrite: NEGATIVE
PROTEIN: NEGATIVE mg/dL
Specific Gravity, Urine: 1.02 (ref 1.005–1.030)
pH: 6 (ref 5.0–8.0)

## 2017-06-13 LAB — URINALYSIS, MICROSCOPIC (REFLEX): WBC UA: NONE SEEN WBC/hpf (ref 0–5)

## 2017-06-13 LAB — LIPASE, BLOOD: Lipase: 23 U/L (ref 11–51)

## 2017-06-13 LAB — I-STAT CG4 LACTIC ACID, ED: Lactic Acid, Venous: 3.3 mmol/L (ref 0.5–1.9)

## 2017-06-13 LAB — TROPONIN I: Troponin I: <0.03 ng/mL (ref <0.03)

## 2017-06-13 MED ORDER — LORAZEPAM 2 MG/ML IJ SOLN
1.0000 mg | Freq: Once | INTRAMUSCULAR | Status: AC
  Administered 2017-06-13: 1 mg via INTRAVENOUS
  Filled 2017-06-13: qty 1

## 2017-06-13 MED ORDER — IOPAMIDOL (ISOVUE-300) INJECTION 61%
100.0000 mL | Freq: Once | INTRAVENOUS | Status: AC | PRN
  Administered 2017-06-13: 100 mL via INTRAVENOUS

## 2017-06-13 MED ORDER — FENTANYL CITRATE (PF) 100 MCG/2ML IJ SOLN
12.5000 ug | Freq: Once | INTRAMUSCULAR | Status: AC
  Administered 2017-06-13: 12.5 ug via INTRAVENOUS
  Filled 2017-06-13: qty 2

## 2017-06-13 MED ORDER — PIPERACILLIN-TAZOBACTAM 3.375 G IVPB
3.3750 g | Freq: Three times a day (TID) | INTRAVENOUS | Status: DC
  Administered 2017-06-13 – 2017-06-15 (×5): 3.375 g via INTRAVENOUS
  Filled 2017-06-13 (×5): qty 50

## 2017-06-13 MED ORDER — LACTATED RINGERS IV SOLN
INTRAVENOUS | Status: DC
  Administered 2017-06-14: 01:00:00 via INTRAVENOUS

## 2017-06-13 MED ORDER — LACTATED RINGERS IV BOLUS (SEPSIS)
1000.0000 mL | Freq: Once | INTRAVENOUS | Status: AC
  Administered 2017-06-13: 1000 mL via INTRAVENOUS

## 2017-06-13 MED ORDER — ONDANSETRON HCL 4 MG/2ML IJ SOLN
4.0000 mg | Freq: Once | INTRAMUSCULAR | Status: AC
  Administered 2017-06-13: 4 mg via INTRAVENOUS
  Filled 2017-06-13: qty 2

## 2017-06-13 NOTE — ED Provider Notes (Signed)
[facility] Provider Note   CSN: [id number] Arrival date & time: 06/13/17  1712     History   Chief Complaint Chief Complaint  Patient presents with  . Abdominal Pain    HPI Alexandra Jordan is a 61 y.o. female with a h/o of an Aortitis/vasculitis who presents to the emergency Department with a chief complaint of epigastric pain that radiates to the RUQ with associated belching, gas, and nausea that began one week ago. She reports her symptoms significantly worsened today. She denies back pain, dysuria, hematuria, emesis, diarrhea. No fever or chills. She has treated her symptoms with OTC gas relief and Pepcid with minimal relief.   Surgical history includes a partial hysterectomy and removal of one ovary and cholecystectomy.  The history is provided by the patient. No language interpreter was used.    Past Medical History:  Diagnosis Date  . Acute thoracic back pain   . Anemia   . Family history of breast cancer   . GERD (gastroesophageal reflux disease)   . Thoracic aorta injury    Intramural hematoma    Patient Active Problem List   Diagnosis Date Noted  . Hyponatremia 06/14/2017  . Aortitis ([employer]) 08/14/2011  . Hypokalemia 08/14/2011  . Anemia 08/14/2011  . Hematuria 08/14/2011    Past Surgical History:  Procedure Laterality Date  . ABDOMINAL HYSTERECTOMY  2000  . CHOLECYSTECTOMY      OB History    No data available       Home Medications    Prior to Admission medications   Medication Sig Start Date End Date Taking? Authorizing Provider  metaxalone (SKELAXIN) 800 MG tablet Take 800 mg by mouth 3 (three) times daily.      [provider]  omeprazole (PRILOSEC) 20 MG capsule Take 20 mg by mouth daily.      [provider]  traMADol (ULTRAM) 50 MG tablet Take 50-100 mg by mouth every 12 (twelve) hours as needed. Maximum dose= 8 tablets per day  ** For pain**     [provider]    Family History Family History    Problem Relation Age of Onset  . Cancer Other        Family History of Breast cancer  . Cancer Mother   . Hyperlipidemia Father   . Hypertension Father   . Deep vein thrombosis Sister     Social History Social History  Substance Use Topics  . Smoking status: Former Smoker    Years: 1.00    Types: Cigarettes    Quit date: 10/20/1979  . Smokeless tobacco: Never Used  . Alcohol use 1.8 oz/week    3 Glasses of wine per week     Allergies   Celecoxib   Review of Systems Review of Systems  Constitutional: Negative for activity change, chills and fever.  Respiratory: Negative for shortness of breath.   Cardiovascular: Negative for chest pain.  Gastrointestinal: Positive for abdominal distention, abdominal pain and nausea. Negative for blood in stool, diarrhea and vomiting.  Genitourinary: Negative for dysuria, frequency, urgency, vaginal bleeding, vaginal discharge and vaginal pain.  Musculoskeletal: Negative for back pain.  Skin: Negative for rash.   Physical Exam Updated Vital Signs BP (!) 137/94 (BP Location: Right Arm)   Pulse (!) 107   Temp 98.7 F (37.1 C) (Oral)   Resp 17   Ht 5' 5.5" (1.664 m)   Wt 69.3 kg (152 lb 12.5 oz)   SpO2 96%   BMI 25.04  kg/m   Physical Exam  Constitutional: No distress.  Uncomfortable appearing  HENT:  Head: Normocephalic.  Eyes: Conjunctivae are normal.  Neck: Neck supple.  Cardiovascular: Normal rate, regular rhythm, normal heart sounds and intact distal pulses.  Exam reveals no gallop and no friction rub.   No murmur heard. Pulmonary/Chest: Effort normal. No respiratory distress. She has no wheezes. She has no rales.  Abdominal: Soft. She exhibits no distension and no mass. There is tenderness. There is no rebound and no guarding. No hernia.  Mild abdominal distention. Normoactive bowel sounds in all 4 quadrants. Tender to palpation in the epigastric and right upper quadrant. No rebound or guarding. No masses or hernias. No  CVA tenderness bilaterally.  Neurological: She is alert.  Skin: Skin is warm. No rash noted.  Psychiatric: Her behavior is normal.  Nursing note and vitals reviewed.    ED Treatments / Results  Labs (all labs ordered are listed, but only abnormal results are displayed) Labs Reviewed  CBC WITH DIFFERENTIAL/PLATELET - Abnormal; Notable for the following:       Result Value   WBC 15.7 (*)    Neutro Abs 12.9 (*)    All other components within normal limits  COMPREHENSIVE METABOLIC PANEL - Abnormal; Notable for the following:    Sodium 126 (*)    Potassium 3.3 (*)    Chloride 90 (*)    CO2 21 (*)    Glucose, Bld 110 (*)    BUN <5 (*)    Total Protein 8.2 (*)    AST 44 (*)    All other components within normal limits  URINALYSIS, ROUTINE W REFLEX MICROSCOPIC - Abnormal; Notable for the following:    Hgb urine dipstick LARGE (*)    Ketones, ur 40 (*)    All other components within normal limits  URINALYSIS, MICROSCOPIC (REFLEX) - Abnormal; Notable for the following:    Bacteria, UA RARE (*)    Squamous Epithelial / LPF 0-5 (*)    All other components within normal limits  I-STAT CG4 LACTIC ACID, ED - Abnormal; Notable for the following:    Lactic Acid, Venous 3.30 (*)    All other components within normal limits  CULTURE, BLOOD (ROUTINE X 2)  CULTURE, BLOOD (ROUTINE X 2)  LIPASE, BLOOD  TROPONIN I  SEDIMENTATION RATE  OSMOLALITY, URINE  OSMOLALITY  SODIUM, URINE, RANDOM  PROCALCITONIN  C-REACTIVE PROTEIN  I-STAT CG4 LACTIC ACID, ED    EKG  EKG Interpretation None       Radiology Ct Chest W Contrast  Result Date: 06/14/2017 CLINICAL DATA:  61 y/o F; epigastric abdominal pain and distention for 1 week. Elevated lactic acid. EXAM: CT CHEST, ABDOMEN, AND PELVIS WITH CONTRAST TECHNIQUE: Multidetector CT imaging of the chest, abdomen and pelvis was performed following the standard protocol during bolus administration of intravenous contrast. CONTRAST:  150m ISOVUE-300  IOPAMIDOL (ISOVUE-300) INJECTION 61% COMPARISON:  09/19/2011 CT of chest. 08/13/2011 CT abdomen and pelvis. FINDINGS: CT CHEST FINDINGS Cardiovascular: No significant vascular findings. Normal heart size. No pericardial effusion. Normal caliber thoracic aorta with mild calcific atherosclerosis. Mediastinum/Nodes: No enlarged mediastinal, hilar, or axillary lymph nodes. Thyroid gland, trachea, and esophagus demonstrate no significant findings. Lungs/Pleura: Right upper lobe pulmonary nodule is minimally increased in size measuring 7 mm, previously 6 mm (series 3, image 49). The new 5 mm right lower lobe pulmonary nodule (series 3, image 135). No consolidation, effusion, or pneumothorax. Musculoskeletal: No chest wall mass or suspicious bone lesions identified. CT  ABDOMEN PELVIS FINDINGS Hepatobiliary: No focal liver abnormality is seen. Hepatic steatosis. Status post cholecystectomy. No biliary dilatation. Pancreas: Unremarkable. No pancreatic ductal dilatation or surrounding inflammatory changes. Spleen: Normal in size without focal abnormality. Adrenals/Urinary Tract: Adrenal glands are unremarkable. Subcentimeter lucencies in the kidneys bilaterally likely representing cysts. Kidneys are otherwise normal, without renal calculi, focal lesion, or hydronephrosis. Bladder is unremarkable. Stomach/Bowel: Stomach is within normal limits. Appendix appears normal. No evidence of bowel wall thickening, distention, or inflammatory changes. Scattered sigmoid diverticulosis. Vascular/Lymphatic: Aortic atherosclerosis with mild calcification. No enlarged abdominal or pelvic lymph nodes. Reproductive: Status post hysterectomy. No adnexal masses. Other: No abdominal wall hernia or abnormality. No abdominopelvic ascites. Musculoskeletal: No fracture is seen. Moderate lumbar levocurvature. IMPRESSION: 1. No acute process identified. 2. Right upper lobe pulmonary nodule is stable to minimally increased in size from 2012 measuring 7  mm, probably benign given long interval. 3. New 5 mm right lower lobe pulmonary nodule. No follow-up needed if patient is low-risk. Non-contrast chest CT can be considered in 12 months if patient is high-risk. This recommendation follows the consensus statement: Guidelines for Management of Incidental Pulmonary Nodules Detected on CT Images: From the Fleischner Society 2017; Radiology 2017; [id number]. 4. Hepatic steatosis. 5. Mild aortic atherosclerosis. Electronically Signed   By: Kristine Garbe M.D.   On: 06/14/2017 00:14   Ct Abdomen Pelvis W Contrast  Result Date: 06/14/2017 CLINICAL DATA:  61 y/o F; epigastric abdominal pain and distention for 1 week. Elevated lactic acid. EXAM: CT CHEST, ABDOMEN, AND PELVIS WITH CONTRAST TECHNIQUE: Multidetector CT imaging of the chest, abdomen and pelvis was performed following the standard protocol during bolus administration of intravenous contrast. CONTRAST:  14m ISOVUE-300 IOPAMIDOL (ISOVUE-300) INJECTION 61% COMPARISON:  09/19/2011 CT of chest. 08/13/2011 CT abdomen and pelvis. FINDINGS: CT CHEST FINDINGS Cardiovascular: No significant vascular findings. Normal heart size. No pericardial effusion. Normal caliber thoracic aorta with mild calcific atherosclerosis. Mediastinum/Nodes: No enlarged mediastinal, hilar, or axillary lymph nodes. Thyroid gland, trachea, and esophagus demonstrate no significant findings. Lungs/Pleura: Right upper lobe pulmonary nodule is minimally increased in size measuring 7 mm, previously 6 mm (series 3, image 49). The new 5 mm right lower lobe pulmonary nodule (series 3, image 135). No consolidation, effusion, or pneumothorax. Musculoskeletal: No chest wall mass or suspicious bone lesions identified. CT ABDOMEN PELVIS FINDINGS Hepatobiliary: No focal liver abnormality is seen. Hepatic steatosis. Status post cholecystectomy. No biliary dilatation. Pancreas: Unremarkable. No pancreatic ductal dilatation or surrounding  inflammatory changes. Spleen: Normal in size without focal abnormality. Adrenals/Urinary Tract: Adrenal glands are unremarkable. Subcentimeter lucencies in the kidneys bilaterally likely representing cysts. Kidneys are otherwise normal, without renal calculi, focal lesion, or hydronephrosis. Bladder is unremarkable. Stomach/Bowel: Stomach is within normal limits. Appendix appears normal. No evidence of bowel wall thickening, distention, or inflammatory changes. Scattered sigmoid diverticulosis. Vascular/Lymphatic: Aortic atherosclerosis with mild calcification. No enlarged abdominal or pelvic lymph nodes. Reproductive: Status post hysterectomy. No adnexal masses. Other: No abdominal wall hernia or abnormality. No abdominopelvic ascites. Musculoskeletal: No fracture is seen. Moderate lumbar levocurvature. IMPRESSION: 1. No acute process identified. 2. Right upper lobe pulmonary nodule is stable to minimally increased in size from 2012 measuring 7 mm, probably benign given long interval. 3. New 5 mm right lower lobe pulmonary nodule. No follow-up needed if patient is low-risk. Non-contrast chest CT can be considered in 12 months if patient is high-risk. This recommendation follows the consensus statement: Guidelines for Management of Incidental Pulmonary Nodules Detected on CT Images: From the Fleischner  Society 2017; Radiology 2017; [phone]. 4. Hepatic steatosis. 5. Mild aortic atherosclerosis. Electronically Signed   By: Kristine Garbe M.D.   On: 06/14/2017 00:14    Procedures Procedures (including critical care time)  Medications Ordered in ED Medications  piperacillin-tazobactam (ZOSYN) IVPB 3.375 g (0 g Intravenous Stopped 06/14/17 0107)  lactated ringers infusion ( Intravenous New Bag/Given 06/14/17 0109)  LORazepam (ATIVAN) injection 1 mg (1 mg Intravenous Given 06/13/17 2259)  fentaNYL (SUBLIMAZE) injection 12.5 mcg (12.5 mcg Intravenous Given 06/13/17 2257)  ondansetron (ZOFRAN) injection 4  mg (4 mg Intravenous Given 06/13/17 2255)  lactated ringers bolus 1,000 mL (0 mLs Intravenous Stopped 06/13/17 2330)  iopamidol (ISOVUE-300) 61 % injection 100 mL (100 mLs Intravenous Contrast Given 06/13/17 2335)     Initial Impression / Assessment and Plan / ED Course  I have reviewed the triage vital signs and the nursing notes.  Pertinent labs & imaging results that were available during my care of the patient were reviewed by me and considered in my medical decision making (see chart for details).     61 year old female presenting with epigastric abdominal pain with abdominal bloating 1 week. Discussed the workup plan with Dr. Sherry Ruffing, attending physician.   Lactate 3.30. WBC 15.7 with Ab. Neutrophil 12.9. Tachycardic in the 110s. Afebrile with rectal temp. Patient meets SIRS criteria and a code sepsis was initiated with Zosyn started. . Na 126, K 3.3, Anion gap 15. AST 44, however ALT, Alk phos, and T. Bili are unremarkable. CT C/A/P unremarkable for acute source of infection. Discussed the lung nodule findings with patient. Also discussed finding of hgb on UA. The patient is post-menopausal without gross hematuria. Consulted Dr. Myna Hidalgo with the hospitalist team who will admit the patient for continued workup and treatment at this time for likely sepsis of unknown origin. The patient appears reasonably stabilized for admission considering the current resources, flow, and capabilities available in the ED at this time, and I doubt any other [facility] requiring further screening and/or treatment in the ED prior to admission.  Final Clinical Impressions(s) / ED Diagnoses   Final diagnoses:  Sepsis, due to unspecified organism [facility])    New Prescriptions New Prescriptions   No medications on file     Joanne Gavel, PA-C 06/14/17 0319    Tegeler, Gwenyth Allegra, MD 06/14/17 1030

## 2017-06-13 NOTE — ED Notes (Signed)
Pt approached this RN complaining of worsening epigastric pain. Pt reported "her heart felt like it was jumping out of her chest" vitals repeated and EKG done.

## 2017-06-13 NOTE — ED Notes (Signed)
Patient transported to CT 

## 2017-06-13 NOTE — ED Triage Notes (Signed)
Abdominal pressure and bloating x1 week intermittently.  Nausea but no vomiting. BMs softer than usual and small amounts. Lots of gas.

## 2017-06-14 DIAGNOSIS — K2900 Acute gastritis without bleeding: Secondary | ICD-10-CM | POA: Diagnosis present

## 2017-06-14 DIAGNOSIS — R1012 Left upper quadrant pain: Secondary | ICD-10-CM | POA: Diagnosis not present

## 2017-06-14 DIAGNOSIS — Z888 Allergy status to other drugs, medicaments and biological substances status: Secondary | ICD-10-CM | POA: Diagnosis not present

## 2017-06-14 DIAGNOSIS — E871 Hypo-osmolality and hyponatremia: Secondary | ICD-10-CM | POA: Diagnosis not present

## 2017-06-14 DIAGNOSIS — R6510 Systemic inflammatory response syndrome (SIRS) of non-infectious origin without acute organ dysfunction: Secondary | ICD-10-CM | POA: Diagnosis not present

## 2017-06-14 DIAGNOSIS — R1013 Epigastric pain: Secondary | ICD-10-CM | POA: Diagnosis not present

## 2017-06-14 DIAGNOSIS — E876 Hypokalemia: Secondary | ICD-10-CM

## 2017-06-14 DIAGNOSIS — R918 Other nonspecific abnormal finding of lung field: Secondary | ICD-10-CM | POA: Diagnosis present

## 2017-06-14 DIAGNOSIS — R109 Unspecified abdominal pain: Secondary | ICD-10-CM

## 2017-06-14 DIAGNOSIS — Z87891 Personal history of nicotine dependence: Secondary | ICD-10-CM | POA: Diagnosis not present

## 2017-06-14 DIAGNOSIS — F419 Anxiety disorder, unspecified: Secondary | ICD-10-CM | POA: Diagnosis present

## 2017-06-14 DIAGNOSIS — K219 Gastro-esophageal reflux disease without esophagitis: Secondary | ICD-10-CM | POA: Diagnosis present

## 2017-06-14 DIAGNOSIS — Z7982 Long term (current) use of aspirin: Secondary | ICD-10-CM | POA: Diagnosis not present

## 2017-06-14 DIAGNOSIS — Z79899 Other long term (current) drug therapy: Secondary | ICD-10-CM | POA: Diagnosis not present

## 2017-06-14 DIAGNOSIS — A419 Sepsis, unspecified organism: Secondary | ICD-10-CM | POA: Diagnosis not present

## 2017-06-14 LAB — BASIC METABOLIC PANEL
Anion gap: 13 (ref 5–15)
CALCIUM: 9.1 mg/dL (ref 8.9–10.3)
CO2: 25 mmol/L (ref 22–32)
CREATININE: 0.63 mg/dL (ref 0.44–1.00)
Chloride: 96 mmol/L — ABNORMAL LOW (ref 101–111)
GFR calc non Af Amer: >60 mL/min (ref >60)
Glucose, Bld: 101 mg/dL — ABNORMAL HIGH (ref 65–99)
Potassium: 3.1 mmol/L — ABNORMAL LOW (ref 3.5–5.1)
SODIUM: 134 mmol/L — AB (ref 135–145)

## 2017-06-14 LAB — CBC
HEMATOCRIT: 38 % (ref 36.0–46.0)
HEMOGLOBIN: 13.2 g/dL (ref 12.0–15.0)
MCH: 31.1 pg (ref 26.0–34.0)
MCHC: 34.7 g/dL (ref 30.0–36.0)
MCV: 89.4 fL (ref 78.0–100.0)
Platelets: 224 10*3/uL (ref 150–400)
RBC: 4.25 MIL/uL (ref 3.87–5.11)
RDW: 12.9 % (ref 11.5–15.5)
WBC: 9.7 10*3/uL (ref 4.0–10.5)

## 2017-06-14 LAB — I-STAT CG4 LACTIC ACID, ED: Lactic Acid, Venous: 1.77 mmol/L (ref 0.5–1.9)

## 2017-06-14 LAB — SODIUM, URINE, RANDOM: SODIUM UR: 52 mmol/L

## 2017-06-14 LAB — SEDIMENTATION RATE: SED RATE: 17 mm/h (ref 0–22)

## 2017-06-14 LAB — C-REACTIVE PROTEIN

## 2017-06-14 LAB — OSMOLALITY, URINE: OSMOLALITY UR: 356 mosm/kg (ref 300–900)

## 2017-06-14 LAB — OSMOLALITY: OSMOLALITY: 277 mosm/kg (ref 275–295)

## 2017-06-14 LAB — PROCALCITONIN: Procalcitonin: <0.1 ng/mL

## 2017-06-14 MED ORDER — ACETAMINOPHEN 325 MG PO TABS: 650.0000 mg | ORAL_TABLET | Freq: Four times a day (QID) | ORAL | Status: DC | PRN

## 2017-06-14 MED ORDER — ONDANSETRON HCL 4 MG PO TABS: 4.0000 mg | ORAL_TABLET | Freq: Four times a day (QID) | ORAL | Status: DC | PRN

## 2017-06-14 MED ORDER — POTASSIUM CHLORIDE IN NACL 40-0.9 MEQ/L-% IV SOLN
INTRAVENOUS | Status: DC
  Administered 2017-06-14 (×2): 75 mL/h via INTRAVENOUS
  Filled 2017-06-14 (×3): qty 1000

## 2017-06-14 MED ORDER — ASPIRIN 81 MG PO CHEW
81.0000 mg | CHEWABLE_TABLET | Freq: Every day | ORAL | Status: DC
  Administered 2017-06-14 – 2017-06-15 (×2): 81 mg via ORAL
  Filled 2017-06-14 (×3): qty 1

## 2017-06-14 MED ORDER — LORAZEPAM 2 MG/ML IJ SOLN
1.0000 mg | Freq: Once | INTRAMUSCULAR | Status: AC
  Administered 2017-06-14: 1 mg via INTRAVENOUS
  Filled 2017-06-14: qty 1

## 2017-06-14 MED ORDER — LORAZEPAM 2 MG/ML IJ SOLN: 0.5000 mg | Freq: Four times a day (QID) | INTRAMUSCULAR | Status: DC | PRN

## 2017-06-14 MED ORDER — ACETAMINOPHEN 650 MG RE SUPP: 650.0000 mg | Freq: Four times a day (QID) | RECTAL | Status: DC | PRN

## 2017-06-14 MED ORDER — POTASSIUM CHLORIDE 20 MEQ/15ML (10%) PO SOLN
40.0000 meq | Freq: Once | ORAL | Status: AC
  Administered 2017-06-14: 40 meq via ORAL
  Filled 2017-06-14: qty 30

## 2017-06-14 MED ORDER — VITAMIN B-1 100 MG PO TABS
100.0000 mg | ORAL_TABLET | Freq: Every day | ORAL | Status: DC
  Administered 2017-06-14 – 2017-06-16 (×3): 100 mg via ORAL
  Filled 2017-06-14 (×3): qty 1

## 2017-06-14 MED ORDER — PANTOPRAZOLE SODIUM 40 MG IV SOLR
40.0000 mg | Freq: Two times a day (BID) | INTRAVENOUS | Status: DC
  Administered 2017-06-14 – 2017-06-15 (×4): 40 mg via INTRAVENOUS
  Filled 2017-06-14 (×4): qty 40

## 2017-06-14 MED ORDER — FOLIC ACID 1 MG PO TABS
1.0000 mg | ORAL_TABLET | Freq: Every day | ORAL | Status: DC
  Administered 2017-06-14 – 2017-06-16 (×3): 1 mg via ORAL
  Filled 2017-06-14 (×3): qty 1

## 2017-06-14 MED ORDER — ONDANSETRON HCL 4 MG/2ML IJ SOLN: 4.0000 mg | Freq: Four times a day (QID) | INTRAMUSCULAR | Status: DC | PRN

## 2017-06-14 MED ORDER — ENOXAPARIN SODIUM 40 MG/0.4ML ~~LOC~~ SOLN
40.0000 mg | SUBCUTANEOUS | Status: DC
  Administered 2017-06-14 – 2017-06-15 (×2): 40 mg via SUBCUTANEOUS
  Filled 2017-06-14 (×2): qty 0.4

## 2017-06-14 MED ORDER — FENTANYL CITRATE (PF) 100 MCG/2ML IJ SOLN: 25.0000 ug | INTRAMUSCULAR | Status: DC | PRN

## 2017-06-14 MED ORDER — SIMETHICONE 80 MG PO CHEW
240.0000 mg | CHEWABLE_TABLET | Freq: Two times a day (BID) | ORAL | Status: DC
  Administered 2017-06-14 – 2017-06-16 (×5): 240 mg via ORAL
  Filled 2017-06-14 (×5): qty 3

## 2017-06-14 MED ORDER — CALCIUM CARBONATE ANTACID 500 MG PO CHEW
1.0000 | CHEWABLE_TABLET | Freq: Three times a day (TID) | ORAL | Status: DC
  Administered 2017-06-14 – 2017-06-16 (×5): 200 mg via ORAL
  Filled 2017-06-14 (×5): qty 1

## 2017-06-14 NOTE — ED Notes (Signed)
ED Provider at bedside. 

## 2017-06-14 NOTE — H&P (Signed)
History and Physical    Alexandra Jordan ZOX:[id number] DOB: Nov 19, 1955 DOA: 06/13/2017  PCP: Daisy Floro, MD  Patient coming from:Home  Chief Complaint:Abdomen pain for 1 week  HPI: Alexandra Jordan is a 61 y.o. female with medical history significant of Aortitis, GERD presented with nausea, bloating and epigastric pain for about a week. Patient reported that the pain is mostly constant, no association with food, no radiation. She describes the pain as pressure-like. Denies vomiting, fever, chills, chest pain, shortness of breath. No recent travel or sick contacts. Patient came to [facility] ER and then transferred to [facility] long for further evaluation and admission. Denied diarrhea, constipation, dysuria, urgency or frequency. Patient reported drinking 2-3 glasses of wine every day.  ED Course: In the ER found to have serum sodium level of 126, potassium 3.3, elevated lactate of 3.3, leukocytosis with a WBC of 15.7. CT scan of abdomen and pelvis unremarkable. CT scan of chest with pulmonary nodules detected. Treated with IV fluid.  Review of Systems: As per HPI otherwise 10 point review of systems negative.    Past Medical History:  Diagnosis Date  . Acute thoracic back pain   . Anemia   . Family history of breast cancer   . GERD (gastroesophageal reflux disease)   . Thoracic aorta injury    Intramural hematoma    Past Surgical History:  Procedure Laterality Date  . ABDOMINAL HYSTERECTOMY  2000  . CHOLECYSTECTOMY      Social history: reports that she quit smoking about 37 years ago. Her smoking use included Cigarettes. She quit after 1.00 year of use. She has never used smokeless tobacco. She reports that she drinks about 1.8 oz of alcohol per week . She reports that she does not use drugs.  Allergies  Allergen Reactions  . Celecoxib Hives    Family History  Problem Relation Age of Onset  . Cancer Other        Family History of Breast cancer  . Cancer Mother   .  Hyperlipidemia Father   . Hypertension Father   . Deep vein thrombosis Sister      Prior to Admission medications   Medication Sig Start Date End Date Taking? Authorizing Provider  aspirin 81 MG chewable tablet Chew 81 mg by mouth daily.   Yes [provider]  calcium carbonate (TUMS - DOSED IN MG ELEMENTAL CALCIUM) 500 MG chewable tablet Chew 1 tablet by mouth daily.   Yes [provider]  famotidine (PEPCID AC) 10 MG chewable tablet Chew 10 mg by mouth daily.   Yes [provider]  ibuprofen (ADVIL,MOTRIN) 200 MG tablet Take 400 mg by mouth every 6 (six) hours as needed for moderate pain.   Yes [provider]  Simethicone (PHAZYME MAXIMUM STRENGTH) 250 MG CAPS Take 250 mg by mouth 2 (two) times daily.   Yes [provider]    Physical Exam: Vitals:   06/13/17 2306 06/13/17 2355 06/14/17 0227 06/14/17 0450  BP:  (!) 138/100 (!) 137/94 (!) 162/86  Pulse:  97 (!) 107 (!) 111  Resp:  12 17 16   Temp: 98.6 F (37 C)  98.7 F (37.1 C) 98.3 F (36.8 C)  TempSrc: Rectal  Oral Oral  SpO2:  98% 96% 99%  Weight:    67 kg (147 lb 11.3 oz)  Height:    5' 5.5" (1.664 m)      Constitutional: NAD, calm, comfortable Vitals:   06/13/17 2306 06/13/17 2355 06/14/17 0227 06/14/17  0450  BP:  (!) 138/100 (!) 137/94 (!) 162/86  Pulse:  97 (!) 107 (!) 111  Resp:  12 17 16   Temp: 98.6 F (37 C)  98.7 F (37.1 C) 98.3 F (36.8 C)  TempSrc: Rectal  Oral Oral  SpO2:  98% 96% 99%  Weight:    67 kg (147 lb 11.3 oz)  Height:    5' 5.5" (1.664 m)   Eyes: PERRL, lids and conjunctivae normal ENMT: Mucous membranes are Dry. Normal dentition.  Neck: normal, supple Respiratory: clear to auscultation bilaterally, no wheezing, no crackles. Normal respiratory effort. No accessory muscle use.  Cardiovascular: Regular rate and rhythm, S1S2 Normal. no murmurs / rubs / gallops. No extremity edema.   Abdomen: Soft, Non-tender, non-distended. Bowel sounds  positive.  Musculoskeletal: no clubbing / cyanosis. No joint deformity upper and lower extremities. Skin: no rashes, lesions, ulcers. No induration Neurologic: CN 2-12 grossly intact. Sensation intact.  Strength 5/5 in all 4.  Psychiatric: Normal judgment and insight. Alert and oriented x 3. Normal mood.    Labs on Admission: I have personally reviewed following labs and imaging studies  CBC:  Recent Labs Lab 06/13/17 2138  WBC 15.7*  NEUTROABS 12.9*  HGB 13.4  HCT 38.6  MCV 91.3  PLT 258   Basic Metabolic Panel:  Recent Labs Lab 06/13/17 2138  NA 126*  K 3.3*  CL 90*  CO2 21*  GLUCOSE 110*  BUN <5*  CREATININE 0.64  CALCIUM 8.9   GFR: Estimated Creatinine Clearance: 70.4 mL/min (by C-G formula based on SCr of 0.64 mg/dL). Liver Function Tests:  Recent Labs Lab 06/13/17 2138  AST 44*  ALT 31  ALKPHOS 93  BILITOT 1.1  PROT 8.2*  ALBUMIN 4.5    Recent Labs Lab 06/13/17 2138  LIPASE 23   No results for input(s): AMMONIA in the last 168 hours. Coagulation Profile: No results for input(s): INR, PROTIME in the last 168 hours. Cardiac Enzymes:  Recent Labs Lab 06/13/17 2138  TROPONINI <0.03   BNP (last 3 results) No results for input(s): PROBNP in the last 8760 hours. HbA1C: No results for input(s): HGBA1C in the last 72 hours. CBG: No results for input(s): GLUCAP in the last 168 hours. Lipid Profile: No results for input(s): CHOL, HDL, LDLCALC, TRIG, CHOLHDL, LDLDIRECT in the last 72 hours. Thyroid Function Tests: No results for input(s): TSH, T4TOTAL, FREET4, T3FREE, THYROIDAB in the last 72 hours. Anemia Panel: No results for input(s): VITAMINB12, FOLATE, FERRITIN, TIBC, IRON, RETICCTPCT in the last 72 hours. Urine analysis:    Component Value Date/Time   COLORURINE YELLOW 06/13/2017 1900   APPEARANCEUR CLEAR 06/13/2017 1900   LABSPEC 1.020 06/13/2017 1900   PHURINE 6.0 06/13/2017 1900   GLUCOSEU NEGATIVE 06/13/2017 1900   HGBUR LARGE  (A) 06/13/2017 1900   BILIRUBINUR NEGATIVE 06/13/2017 1900   KETONESUR 40 (A) 06/13/2017 1900   PROTEINUR NEGATIVE 06/13/2017 1900   UROBILINOGEN 0.2 08/14/2011 0309   NITRITE NEGATIVE 06/13/2017 1900   LEUKOCYTESUR NEGATIVE 06/13/2017 1900   Sepsis Labs: !!!!!!!!!!!!!!!!!!!!!!!!!!!!!!!!!!!!!!!!!!!! @LABRCNTIP (procalcitonin:4,lacticidven:4) )No results found for this or any previous visit (from the past 240 hour(s)).   Radiological Exams on Admission: Ct Chest W Contrast  Result Date: 06/14/2017 CLINICAL DATA:  61 y/o F; epigastric abdominal pain and distention for 1 week. Elevated lactic acid. EXAM: CT CHEST, ABDOMEN, AND PELVIS WITH CONTRAST TECHNIQUE: Multidetector CT imaging of the chest, abdomen and pelvis was performed following the standard protocol during bolus administration of intravenous contrast. CONTRAST:  ISOVUE-300 IOPAMIDOL (ISOVUE-300) INJECTION 61% COMPARISON:  09/19/2011 CT of chest. 08/13/2011 CT abdomen and pelvis. FINDINGS: CT CHEST FINDINGS Cardiovascular: No significant vascular findings. Normal heart size. No pericardial effusion. Normal caliber thoracic aorta with mild calcific atherosclerosis. Mediastinum/Nodes: No enlarged mediastinal, hilar, or axillary lymph nodes. Thyroid gland, trachea, and esophagus demonstrate no significant findings. Lungs/Pleura: Right upper lobe pulmonary nodule is minimally increased in size measuring 7 mm, previously 6 mm (series 3, image 49). The new 5 mm right lower lobe pulmonary nodule (series 3, image 135). No consolidation, effusion, or pneumothorax. Musculoskeletal: No chest wall mass or suspicious bone lesions identified. CT ABDOMEN PELVIS FINDINGS Hepatobiliary: No focal liver abnormality is seen. Hepatic steatosis. Status post cholecystectomy. No biliary dilatation. Pancreas: Unremarkable. No pancreatic ductal dilatation or surrounding inflammatory changes. Spleen: Normal in size without focal abnormality. Adrenals/Urinary Tract:  Adrenal glands are unremarkable. Subcentimeter lucencies in the kidneys bilaterally likely representing cysts. Kidneys are otherwise normal, without renal calculi, focal lesion, or hydronephrosis. Bladder is unremarkable. Stomach/Bowel: Stomach is within normal limits. Appendix appears normal. No evidence of bowel wall thickening, distention, or inflammatory changes. Scattered sigmoid diverticulosis. Vascular/Lymphatic: Aortic atherosclerosis with mild calcification. No enlarged abdominal or pelvic lymph nodes. Reproductive: Status post hysterectomy. No adnexal masses. Other: No abdominal wall hernia or abnormality. No abdominopelvic ascites. Musculoskeletal: No fracture is seen. Moderate lumbar levocurvature. IMPRESSION: 1. No acute process identified. 2. Right upper lobe pulmonary nodule is stable to minimally increased in size from 2012 measuring 7 mm, probably benign given long interval. 3. New 5 mm right lower lobe pulmonary nodule. No follow-up needed if patient is low-risk. Non-contrast chest CT can be considered in 12 months if patient is high-risk. This recommendation follows the consensus statement: Guidelines for Management of Incidental Pulmonary Nodules Detected on CT Images: From the Fleischner Society 2017; Radiology 2017; 284:228-243. 4. Hepatic steatosis. 5. Mild aortic atherosclerosis. Electronically Signed   By: Mitzi Hansen M.D.   On: 06/14/2017 00:14   Ct Abdomen Pelvis W Contrast  Result Date: 06/14/2017 CLINICAL DATA:  61 y/o F; epigastric abdominal pain and distention for 1 week. Elevated lactic acid. EXAM: CT CHEST, ABDOMEN, AND PELVIS WITH CONTRAST TECHNIQUE: Multidetector CT imaging of the chest, abdomen and pelvis was performed following the standard protocol during bolus administration of intravenous contrast. CONTRAST:  ISOVUE-300 IOPAMIDOL (ISOVUE-300) INJECTION 61% COMPARISON:  09/19/2011 CT of chest. 08/13/2011 CT abdomen and pelvis. FINDINGS: CT CHEST FINDINGS  Cardiovascular: No significant vascular findings. Normal heart size. No pericardial effusion. Normal caliber thoracic aorta with mild calcific atherosclerosis. Mediastinum/Nodes: No enlarged mediastinal, hilar, or axillary lymph nodes. Thyroid gland, trachea, and esophagus demonstrate no significant findings. Lungs/Pleura: Right upper lobe pulmonary nodule is minimally increased in size measuring 7 mm, previously 6 mm (series 3, image 49). The new 5 mm right lower lobe pulmonary nodule (series 3, image 135). No consolidation, effusion, or pneumothorax. Musculoskeletal: No chest wall mass or suspicious bone lesions identified. CT ABDOMEN PELVIS FINDINGS Hepatobiliary: No focal liver abnormality is seen. Hepatic steatosis. Status post cholecystectomy. No biliary dilatation. Pancreas: Unremarkable. No pancreatic ductal dilatation or surrounding inflammatory changes. Spleen: Normal in size without focal abnormality. Adrenals/Urinary Tract: Adrenal glands are unremarkable. Subcentimeter lucencies in the kidneys bilaterally likely representing cysts. Kidneys are otherwise normal, without renal calculi, focal lesion, or hydronephrosis. Bladder is unremarkable. Stomach/Bowel: Stomach is within normal limits. Appendix appears normal. No evidence of bowel wall thickening, distention, or inflammatory changes. Scattered sigmoid diverticulosis. Vascular/Lymphatic: Aortic atherosclerosis with mild calcification. No enlarged  abdominal or pelvic lymph nodes. Reproductive: Status post hysterectomy. No adnexal masses. Other: No abdominal wall hernia or abnormality. No abdominopelvic ascites. Musculoskeletal: No fracture is seen. Moderate lumbar levocurvature. IMPRESSION: 1. No acute process identified. 2. Right upper lobe pulmonary nodule is stable to minimally increased in size from 2012 measuring 7 mm, probably benign given long interval. 3. New 5 mm right lower lobe pulmonary nodule. No follow-up needed if patient is low-risk.  Non-contrast chest CT can be considered in 12 months if patient is high-risk. This recommendation follows the consensus statement: Guidelines for Management of Incidental Pulmonary Nodules Detected on CT Images: From the Fleischner Society 2017; Radiology 2017; 284:228-243. 4. Hepatic steatosis. 5. Mild aortic atherosclerosis. Electronically Signed   By: Mitzi HansenLance  Furusawa-Stratton M.D.   On: 06/14/2017 00:14    EKG: ordered  Assessment/Plan Active Problems:   Hyponatremia   Pain in the abdomen  # Epigastric pain and bloating likely due to acute gastritis in the setting of NSAIDs use and may be contributed by alcohol consumption versus gastroenteritis. Patient with history of GERD. CT scan of abdomen and pelvis unremarkable for acute finding. Lipase level normal. Abdomen exam benign. -Elevated lactate on admission likely due to dehydration which is improved with IV fluid. -Order Protonix IV twice a day, tums -Start clear liquid diet and advance as tolerated.  # SIRS on admission ? Gastritis: Tachycardia, leukocytosis and elevated lactate on admission. Lactic acid level improving with IV fluid. Follow up culture results. Continue empiric antibiotics Zosyn which I think can be weaned off rapidly.  #Hyponatremia and hypokalemia likely due to decreased oral intake and dehydration: Repeat lab in the morning. Add potassium chloride in IV fluid. Monitor BMP. Follow-up urine sodium, potassium and urine osmolality. Check EKG.  # Incidental finding of right lower lobe 5 mm nodal and minimally increased in size of right upper lobe pulmonary nodule which is 7 mm. Patient denies smoking. Her mother reported to have benign lung nodule. I recommended patient to have follow-up imaging studies in 6-12 months. They verbalized understanding.  # Alcohol use, watch for alcohol withdrawal: Patient drinks about 2-3 glasses of wine every day. I will observe for any sign of withdrawal. On IV fluid. Add vitamins.  DVT  prophylaxis: Lovenox subcutaneous Code Status: Full code Family Communication: Discussed with the patient's daughter at bedside Disposition Plan: Admitted Consults called: None Admission status: Inpatient   Lindee Leason Jaynie CollinsPrasad Jeury Mcnab MD [facility] Pager [phone]  If 7PM-7AM, please contact night-coverage [email or web address] Password [email or web address]  06/14/2017, 8:07 AM

## 2017-06-15 DIAGNOSIS — A419 Sepsis, unspecified organism: Secondary | ICD-10-CM

## 2017-06-15 LAB — BASIC METABOLIC PANEL
ANION GAP: 8 (ref 5–15)
CALCIUM: 8.7 mg/dL — AB (ref 8.9–10.3)
CO2: 25 mmol/L (ref 22–32)
Chloride: 107 mmol/L (ref 101–111)
Creatinine, Ser: 0.7 mg/dL (ref 0.44–1.00)
GFR calc Af Amer: >60 mL/min (ref >60)
GLUCOSE: 88 mg/dL (ref 65–99)
Potassium: 3.8 mmol/L (ref 3.5–5.1)
Sodium: 140 mmol/L (ref 135–145)

## 2017-06-15 LAB — CBC
HCT: 35.9 % — ABNORMAL LOW (ref 36.0–46.0)
HEMOGLOBIN: 12.1 g/dL (ref 12.0–15.0)
MCH: 30.9 pg (ref 26.0–34.0)
MCHC: 33.7 g/dL (ref 30.0–36.0)
MCV: 91.8 fL (ref 78.0–100.0)
Platelets: 196 10*3/uL (ref 150–400)
RBC: 3.91 MIL/uL (ref 3.87–5.11)
RDW: 13.2 % (ref 11.5–15.5)
WBC: 5.6 10*3/uL (ref 4.0–10.5)

## 2017-06-15 MED ORDER — LORAZEPAM 0.5 MG PO TABS
0.5000 mg | ORAL_TABLET | Freq: Four times a day (QID) | ORAL | Status: DC | PRN
  Administered 2017-06-15 – 2017-06-16 (×2): 0.5 mg via ORAL
  Filled 2017-06-15 (×2): qty 1

## 2017-06-15 NOTE — Progress Notes (Signed)
PROGRESS NOTE    Alexandra Jordan  ZOX:[id number] DOB: 1956-07-09 DOA: 06/13/2017 PCP: Daisy Floro, MD    Brief Narrative: 61 yo female with history of daily consumption of wine upto 3 glasses daily with ho gerd admitted with abdominal pain.she reports having a lot of gas which shes unable to pass.today she feels better but woried what if the pain comes back.she is resting in bed.family by thr bed side.    Assessment & Plan:   Active Problems:   Hyponatremia   Pain in the abdomen   SIRS (systemic inflammatory response syndrome) (HCC)  Abdominal pain due to gastritis/excessive gas.will continue protonix add simethicone.encourage ambulation.pt consult.dc ivf.dc zosyn.normal wbc count,no fever,ct abdomen unremarkable.outpatient gifollow up.  Hyponatremia sodium up to 140 from 126 with only ns ivf.Kennon Portela spurious/false?  Leukocytosis stress?dehydration?no evidence of infection.      DVT prophylaxis: lovenox Code Status: full Family Communication husband  Objective: Vitals:   06/14/17 1353 06/14/17 2324 06/15/17 0643 06/15/17 1300  BP: (!) 151/87 (!) 171/86 (!) 152/95 (!) 155/85  Pulse: 83 71 66 68  Resp: Temp: 98 F (36.7 C) 98.3 F (36.8 C) 99 F (37.2 C)   TempSrc: Oral Oral Oral Oral  SpO2: 97% 98% 98% 97%  Weight:      Height:        Intake/Output Summary (Last 24 hours) at 06/15/17 1459 Last data filed at 06/15/17 1400  Gross per 24 hour  Intake          1316.25 ml  Output                0 ml  Net          1316.25 ml   Filed Weights   06/13/17 1755 06/14/17 0450  Weight: 69.3 kg (152 lb 12.5 oz) 67 kg (147 lb 11.3 oz)    Examination:  General exam: Appears calm and comfortable  Respiratory system: Clear to auscultation. Respiratory effort normal. Cardiovascular system: S1 & S2 heard, RRR. No JVD, murmurs, rubs, gallops or clicks. No pedal edema. Gastrointestinal system: Abdomen is nondistended, soft and nontender. No  organomegaly or masses felt. Normal bowel sounds heard. Central nervous system: Alert and oriented. No focal neurological deficits. Extremities: Symmetric 5 x 5 power. Skin: No rashes, lesions or ulcers Psychiatry: Judgement and insight appear normal. Mood & affect appropriate.     Data Reviewed: I have personally reviewed following labs and imaging studies  CBC:  Recent Labs Lab 06/13/17 2138 06/14/17 0733 06/15/17 0351  WBC 15.7* 9.7 5.6  NEUTROABS 12.9*  --   --   HGB 13.4 13.2 12.1  HCT 38.6 38.0 35.9*  MCV 91.3 89.4 91.8  PLT 258 224 196   Basic Metabolic Panel:  Recent Labs Lab 06/13/17 2138 06/14/17 0733 06/15/17 0351  NA 126* 134* 140  K 3.3* 3.1* 3.8  CL 90* 96* 107  CO2 21* 25 25  GLUCOSE 110* 101* 88  BUN <5* <5* <5*  CREATININE 0.64 0.63 0.70  CALCIUM 8.9 9.1 8.7*   GFR: Estimated Creatinine Clearance: 70.4 mL/min (by C-G formula based on SCr of 0.7 mg/dL). Liver Function Tests:  Recent Labs Lab 06/13/17 2138  AST 44*  ALT 31  ALKPHOS 93  BILITOT 1.1  PROT 8.2*  ALBUMIN 4.5    Recent Labs Lab 06/13/17 2138  LIPASE 23   No results for input(s): AMMONIA in the last 168 hours. Coagulation Profile: No results for input(s): INR, PROTIME  in the last 168 hours. Cardiac Enzymes:  Recent Labs Lab 06/13/17 2138  TROPONINI <0.03   BNP (last 3 results) No results for input(s): PROBNP in the last 8760 hours. HbA1C: No results for input(s): HGBA1C in the last 72 hours. CBG: No results for input(s): GLUCAP in the last 168 hours. Lipid Profile: No results for input(s): CHOL, HDL, LDLCALC, TRIG, CHOLHDL, LDLDIRECT in the last 72 hours. Thyroid Function Tests: No results for input(s): TSH, T4TOTAL, FREET4, T3FREE, THYROIDAB in the last 72 hours. Anemia Panel: No results for input(s): VITAMINB12, FOLATE, FERRITIN, TIBC, IRON, RETICCTPCT in the last 72 hours. Sepsis Labs:  Recent Labs Lab 06/13/17 2138 06/13/17 2201 06/14/17 0124    PROCALCITON <0.10  --   --   LATICACIDVEN  --  3.30* 1.77    Recent Results (from the past 240 hour(s))  Blood Culture (routine x 2)     Status: None (Preliminary result)   Collection Time: 06/13/17 10:45 PM  Result Value Ref Range Status   Specimen Description BLOOD RIGHT ANTECUBITAL  Final   Special Requests   Final    BOTTLES DRAWN AEROBIC AND ANAEROBIC Blood Culture adequate volume   Culture   Final    NO GROWTH 1 DAY Performed at [facility], [address], [place], [place] [address]    Report Status PENDING  Incomplete  Blood Culture (routine x 2)     Status: None (Preliminary result)   Collection Time: 06/13/17 10:45 PM  Result Value Ref Range Status   Specimen Description BLOOD LEFT ANTECUBITAL  Final   Special Requests   Final    BOTTLES DRAWN AEROBIC AND ANAEROBIC Blood Culture adequate volume   Culture   Final    NO GROWTH 1 DAY Performed at [facility], [address] [place], [place] [address]    Report Status PENDING  Incomplete         Radiology Studies: Ct Chest W Contrast  Result Date: 06/14/2017 CLINICAL DATA:  61 y/o F; epigastric abdominal pain and distention for 1 week. Elevated lactic acid. EXAM: CT CHEST, ABDOMEN, AND PELVIS WITH CONTRAST TECHNIQUE: Multidetector CT imaging of the chest, abdomen and pelvis was performed following the standard protocol during bolus administration of intravenous contrast. CONTRAST:  ISOVUE-300 IOPAMIDOL (ISOVUE-300) INJECTION 61% COMPARISON:  09/19/2011 CT of chest. 08/13/2011 CT abdomen and pelvis. FINDINGS: CT CHEST FINDINGS Cardiovascular: No significant vascular findings. Normal heart size. No pericardial effusion. Normal caliber thoracic aorta with mild calcific atherosclerosis. Mediastinum/Nodes: No enlarged mediastinal, hilar, or axillary lymph nodes. Thyroid gland, trachea, and esophagus demonstrate no significant findings. Lungs/Pleura: Right upper lobe pulmonary nodule is minimally increased in  size measuring 7 mm, previously 6 mm (series 3, image 49). The new 5 mm right lower lobe pulmonary nodule (series 3, image 135). No consolidation, effusion, or pneumothorax. Musculoskeletal: No chest wall mass or suspicious bone lesions identified. CT ABDOMEN PELVIS FINDINGS Hepatobiliary: No focal liver abnormality is seen. Hepatic steatosis. Status post cholecystectomy. No biliary dilatation. Pancreas: Unremarkable. No pancreatic ductal dilatation or surrounding inflammatory changes. Spleen: Normal in size without focal abnormality. Adrenals/Urinary Tract: Adrenal glands are unremarkable. Subcentimeter lucencies in the kidneys bilaterally likely representing cysts. Kidneys are otherwise normal, without renal calculi, focal lesion, or hydronephrosis. Bladder is unremarkable. Stomach/Bowel: Stomach is within normal limits. Appendix appears normal. No evidence of bowel wall thickening, distention, or inflammatory changes. Scattered sigmoid diverticulosis. Vascular/Lymphatic: Aortic atherosclerosis with mild calcification. No enlarged abdominal or pelvic lymph nodes. Reproductive: Status  post hysterectomy. No adnexal masses. Other: No abdominal wall hernia or abnormality. No abdominopelvic ascites. Musculoskeletal: No fracture is seen. Moderate lumbar levocurvature. IMPRESSION: 1. No acute process identified. 2. Right upper lobe pulmonary nodule is stable to minimally increased in size from 2012 measuring 7 mm, probably benign given long interval. 3. New 5 mm right lower lobe pulmonary nodule. No follow-up needed if patient is low-risk. Non-contrast chest CT can be considered in 12 months if patient is high-risk. This recommendation follows the consensus statement: Guidelines for Management of Incidental Pulmonary Nodules Detected on CT Images: From the Fleischner Society 2017; Radiology 2017; 284:228-243. 4. Hepatic steatosis. 5. Mild aortic atherosclerosis. Electronically Signed   By: Mitzi HansenLance  Furusawa-Stratton M.D.    On: 06/14/2017 00:14   Ct Abdomen Pelvis W Contrast  Result Date: 06/14/2017 CLINICAL DATA:  61 y/o F; epigastric abdominal pain and distention for 1 week. Elevated lactic acid. EXAM: CT CHEST, ABDOMEN, AND PELVIS WITH CONTRAST TECHNIQUE: Multidetector CT imaging of the chest, abdomen and pelvis was performed following the standard protocol during bolus administration of intravenous contrast. CONTRAST:  100mL ISOVUE-300 IOPAMIDOL (ISOVUE-300) INJECTION 61% COMPARISON:  09/19/2011 CT of chest. 08/13/2011 CT abdomen and pelvis. FINDINGS: CT CHEST FINDINGS Cardiovascular: No significant vascular findings. Normal heart size. No pericardial effusion. Normal caliber thoracic aorta with mild calcific atherosclerosis. Mediastinum/Nodes: No enlarged mediastinal, hilar, or axillary lymph nodes. Thyroid gland, trachea, and esophagus demonstrate no significant findings. Lungs/Pleura: Right upper lobe pulmonary nodule is minimally increased in size measuring 7 mm, previously 6 mm (series 3, image 49). The new 5 mm right lower lobe pulmonary nodule (series 3, image 135). No consolidation, effusion, or pneumothorax. Musculoskeletal: No chest wall mass or suspicious bone lesions identified. CT ABDOMEN PELVIS FINDINGS Hepatobiliary: No focal liver abnormality is seen. Hepatic steatosis. Status post cholecystectomy. No biliary dilatation. Pancreas: Unremarkable. No pancreatic ductal dilatation or surrounding inflammatory changes. Spleen: Normal in size without focal abnormality. Adrenals/Urinary Tract: Adrenal glands are unremarkable. Subcentimeter lucencies in the kidneys bilaterally likely representing cysts. Kidneys are otherwise normal, without renal calculi, focal lesion, or hydronephrosis. Bladder is unremarkable. Stomach/Bowel: Stomach is within normal limits. Appendix appears normal. No evidence of bowel wall thickening, distention, or inflammatory changes. Scattered sigmoid diverticulosis. Vascular/Lymphatic: Aortic  atherosclerosis with mild calcification. No enlarged abdominal or pelvic lymph nodes. Reproductive: Status post hysterectomy. No adnexal masses. Other: No abdominal wall hernia or abnormality. No abdominopelvic ascites. Musculoskeletal: No fracture is seen. Moderate lumbar levocurvature. IMPRESSION: 1. No acute process identified. 2. Right upper lobe pulmonary nodule is stable to minimally increased in size from 2012 measuring 7 mm, probably benign given long interval. 3. New 5 mm right lower lobe pulmonary nodule. No follow-up needed if patient is low-risk. Non-contrast chest CT can be considered in 12 months if patient is high-risk. This recommendation follows the consensus statement: Guidelines for Management of Incidental Pulmonary Nodules Detected on CT Images: From the Fleischner Society 2017; Radiology 2017; 284:228-243. 4. Hepatic steatosis. 5. Mild aortic atherosclerosis. Electronically Signed   By: Mitzi HansenLance  Furusawa-Stratton M.D.   On: 06/14/2017 00:14        Scheduled Meds: . aspirin  81 mg Oral Daily  . calcium carbonate  1 tablet Oral TID WC  . folic acid  1 mg Oral Daily  . pantoprazole (PROTONIX) IV  40 mg Intravenous Q12H  . simethicone  240 mg Oral BID  . thiamine  100 mg Oral Daily   Continuous Infusions:   LOS: 1 day    Time spent:  Alwyn Ren, MD [facility]   If 7PM-7AM, please contact night-coverage [email or web address] Password [email or web address] 06/15/2017, 2:59 PM

## 2017-06-15 NOTE — Plan of Care (Signed)
Problem: Nutrition: Goal: Adequate nutrition will be maintained Outcome: Not Progressing Pt on a Full Liquid diet

## 2017-06-16 DIAGNOSIS — E871 Hypo-osmolality and hyponatremia: Secondary | ICD-10-CM

## 2017-06-16 DIAGNOSIS — R1012 Left upper quadrant pain: Secondary | ICD-10-CM

## 2017-06-16 MED ORDER — LACTULOSE 10 GM/15ML PO SOLN
30.0000 g | Freq: Every day | ORAL | Status: DC
  Filled 2017-06-16: qty 60

## 2017-06-16 MED ORDER — THIAMINE HCL 100 MG PO TABS: 100.0000 mg | ORAL_TABLET | Freq: Every day | ORAL | 0 refills | Status: DC

## 2017-06-16 MED ORDER — PANTOPRAZOLE SODIUM 40 MG PO TBEC: 40.0000 mg | DELAYED_RELEASE_TABLET | Freq: Two times a day (BID) | ORAL | 0 refills | Status: DC

## 2017-06-16 MED ORDER — PANTOPRAZOLE SODIUM 40 MG PO TBEC
40.0000 mg | DELAYED_RELEASE_TABLET | Freq: Two times a day (BID) | ORAL | Status: DC
  Administered 2017-06-16: 40 mg via ORAL
  Filled 2017-06-16: qty 1

## 2017-06-16 MED ORDER — LORAZEPAM 0.5 MG PO TABS: 0.5000 mg | ORAL_TABLET | Freq: Four times a day (QID) | ORAL | 0 refills | Status: AC | PRN

## 2017-06-16 NOTE — Progress Notes (Signed)
PT Cancellation Note  Patient Details Name: Alexandra Jordan MRN: [id number] DOB: Jul 05, 1956   Cancelled Treatment:    Reason Eval/Treat Not Completed: PT screened, no needs identified, will sign off. Reviewed chart, spoke with pt/family, and nurse.  Pt is ambulating independently and no need for skilled PT evaluation. Will sign off.   Enzo MontgomeryKaren L Rubylee Zamarripa 06/16/2017, 9:07 AM

## 2017-06-16 NOTE — Progress Notes (Signed)
Patient given discharge, follow up, and medication instructions, verbalized understanding, no IV access prior to DC, personal belongings with patient, family to transport home

## 2017-06-16 NOTE — Plan of Care (Signed)
Problem: Safety: Goal: Ability to remain free from injury will improve Outcome: Progressing Pt ambulating with steady gait.

## 2017-06-16 NOTE — Discharge Summary (Signed)
Physician Discharge Summary  Alexandra Jordan ZOX:[id number] DOB: [birth year] DOA: 06/13/2017  PCP: Daisy Floro, MD  Admit date: 06/13/2017 Discharge date: 06/16/2017  Admitted From home Disposition:  home  Recommendations for Outpatient Follow-up:  1. Follow up with PCP in 1-2 weeks 2. Please obtain BMP/CBC in one week  Home Health Equipment/Devices:none  Discharge Condition:stable CODE STATUS:full Diet recommendation: regular  Brief/Interim Summary 61 yo female admitted with abdominal discomfort thought to be secondary to gastritis and gas.patient was treated with zosyn at the time of admission. For possible sepsis.  Discharge Diagnoses:  Active Problems:   Hyponatremia   Pain in the abdomen   SIRS (systemic inflammatory response syndrome) (HCC)  Gastritis continue ppi.follow up with pcp.gi conslut as outpatient.dw patinet and family.  Discharge Instructions   Allergies as of 06/16/2017      Reactions   Celecoxib Hives      Medication List    STOP taking these medications   aspirin 81 MG chewable tablet   calcium carbonate 500 MG chewable tablet Commonly known as:  TUMS - dosed in mg elemental calcium   famotidine 10 MG chewable tablet Commonly known as:  PEPCID AC   ibuprofen 200 MG tablet Commonly known as:  ADVIL,MOTRIN     TAKE these medications   LORazepam 0.5 MG tablet Commonly known as:  ATIVAN Take 1 tablet (0.5 mg total) by mouth every 6 (six) hours as needed for anxiety.   pantoprazole 40 MG tablet Commonly known as:  PROTONIX Take 1 tablet (40 mg total) by mouth 2 (two) times daily.   PHAZYME MAXIMUM STRENGTH 250 MG Caps Generic drug:  Simethicone Take 250 mg by mouth 2 (two) times daily.   thiamine 100 MG tablet Take 1 tablet (100 mg total) by mouth daily.            Discharge Care Instructions        Start     Ordered   06/17/17 0000  thiamine 100 MG tablet  Daily     06/16/17 0841   06/16/17 0000  LORazepam (ATIVAN)  0.5 MG tablet  Every 6 hours PRN     06/16/17 0841   06/16/17 0000  pantoprazole (PROTONIX) 40 MG tablet  2 times daily     06/16/17 0841      Allergies  Allergen Reactions  . Celecoxib Hives    Consultations: Gi dr Doretha Imus   Procedures/Studies: Ct Chest W Contrast  Result Date: 06/14/2017 CLINICAL DATA:  61 y/o F; epigastric abdominal pain and distention for 1 week. Elevated lactic acid. EXAM: CT CHEST, ABDOMEN, AND PELVIS WITH CONTRAST TECHNIQUE: Multidetector CT imaging of the chest, abdomen and pelvis was performed following the standard protocol during bolus administration of intravenous contrast. CONTRAST:  ISOVUE-300 IOPAMIDOL (ISOVUE-300) INJECTION 61% COMPARISON:  09/19/2011 CT of chest. 08/13/2011 CT abdomen and pelvis. FINDINGS: CT CHEST FINDINGS Cardiovascular: No significant vascular findings. Normal heart size. No pericardial effusion. Normal caliber thoracic aorta with mild calcific atherosclerosis. Mediastinum/Nodes: No enlarged mediastinal, hilar, or axillary lymph nodes. Thyroid gland, trachea, and esophagus demonstrate no significant findings. Lungs/Pleura: Right upper lobe pulmonary nodule is minimally increased in size measuring 7 mm, previously 6 mm (series 3, image 49). The new 5 mm right lower lobe pulmonary nodule (series 3, image 135). No consolidation, effusion, or pneumothorax. Musculoskeletal: No chest wall mass or suspicious bone lesions identified. CT ABDOMEN PELVIS FINDINGS Hepatobiliary: No focal liver abnormality is seen. Hepatic steatosis. Status post cholecystectomy. No biliary dilatation. Pancreas:  Unremarkable. No pancreatic ductal dilatation or surrounding inflammatory changes. Spleen: Normal in size without focal abnormality. Adrenals/Urinary Tract: Adrenal glands are unremarkable. Subcentimeter lucencies in the kidneys bilaterally likely representing cysts. Kidneys are otherwise normal, without renal calculi, focal lesion, or hydronephrosis. Bladder  is unremarkable. Stomach/Bowel: Stomach is within normal limits. Appendix appears normal. No evidence of bowel wall thickening, distention, or inflammatory changes. Scattered sigmoid diverticulosis. Vascular/Lymphatic: Aortic atherosclerosis with mild calcification. No enlarged abdominal or pelvic lymph nodes. Reproductive: Status post hysterectomy. No adnexal masses. Other: No abdominal wall hernia or abnormality. No abdominopelvic ascites. Musculoskeletal: No fracture is seen. Moderate lumbar levocurvature. IMPRESSION: 1. No acute process identified. 2. Right upper lobe pulmonary nodule is stable to minimally increased in size from 2012 measuring 7 mm, probably benign given long interval. 3. New 5 mm right lower lobe pulmonary nodule. No follow-up needed if patient is low-risk. Non-contrast chest CT can be considered in 12 months if patient is high-risk. This recommendation follows the consensus statement: Guidelines for Management of Incidental Pulmonary Nodules Detected on CT Images: From the Fleischner Society 2017; Radiology 2017; 284:228-243. 4. Hepatic steatosis. 5. Mild aortic atherosclerosis. Electronically Signed   By: Mitzi Hansen M.D.   On: 06/14/2017 00:14   Ct Abdomen Pelvis W Contrast  Result Date: 06/14/2017 CLINICAL DATA:  61 y/o F; epigastric abdominal pain and distention for 1 week. Elevated lactic acid. EXAM: CT CHEST, ABDOMEN, AND PELVIS WITH CONTRAST TECHNIQUE: Multidetector CT imaging of the chest, abdomen and pelvis was performed following the standard protocol during bolus administration of intravenous contrast. CONTRAST:  ISOVUE-300 IOPAMIDOL (ISOVUE-300) INJECTION 61% COMPARISON:  09/19/2011 CT of chest. 08/13/2011 CT abdomen and pelvis. FINDINGS: CT CHEST FINDINGS Cardiovascular: No significant vascular findings. Normal heart size. No pericardial effusion. Normal caliber thoracic aorta with mild calcific atherosclerosis. Mediastinum/Nodes: No enlarged mediastinal,  hilar, or axillary lymph nodes. Thyroid gland, trachea, and esophagus demonstrate no significant findings. Lungs/Pleura: Right upper lobe pulmonary nodule is minimally increased in size measuring 7 mm, previously 6 mm (series 3, image 49). The new 5 mm right lower lobe pulmonary nodule (series 3, image 135). No consolidation, effusion, or pneumothorax. Musculoskeletal: No chest wall mass or suspicious bone lesions identified. CT ABDOMEN PELVIS FINDINGS Hepatobiliary: No focal liver abnormality is seen. Hepatic steatosis. Status post cholecystectomy. No biliary dilatation. Pancreas: Unremarkable. No pancreatic ductal dilatation or surrounding inflammatory changes. Spleen: Normal in size without focal abnormality. Adrenals/Urinary Tract: Adrenal glands are unremarkable. Subcentimeter lucencies in the kidneys bilaterally likely representing cysts. Kidneys are otherwise normal, without renal calculi, focal lesion, or hydronephrosis. Bladder is unremarkable. Stomach/Bowel: Stomach is within normal limits. Appendix appears normal. No evidence of bowel wall thickening, distention, or inflammatory changes. Scattered sigmoid diverticulosis. Vascular/Lymphatic: Aortic atherosclerosis with mild calcification. No enlarged abdominal or pelvic lymph nodes. Reproductive: Status post hysterectomy. No adnexal masses. Other: No abdominal wall hernia or abnormality. No abdominopelvic ascites. Musculoskeletal: No fracture is seen. Moderate lumbar levocurvature. IMPRESSION: 1. No acute process identified. 2. Right upper lobe pulmonary nodule is stable to minimally increased in size from 2012 measuring 7 mm, probably benign given long interval. 3. New 5 mm right lower lobe pulmonary nodule. No follow-up needed if patient is low-risk. Non-contrast chest CT can be considered in 12 months if patient is high-risk. This recommendation follows the consensus statement: Guidelines for Management of Incidental Pulmonary Nodules Detected on CT  Images: From the Fleischner Society 2017; Radiology 2017; 284:228-243. 4. Hepatic steatosis. 5. Mild aortic atherosclerosis. Electronically Signed   By: Micah Noel  [place]urus[place]-Stratton M.D.   On: 06/14/2017 00:14    (Echo, Carotid, EGD, Colonoscopy, ERCP)    Subjective:   Discharge Exam: Vitals:   06/15/17 2233 06/16/17 0449  BP: (!) 153/81 (!) 153/87  Pulse: 66 79  Resp: 16 18  Temp: 98.4 F (36.9 C) 97.9 F (36.6 C)  SpO2: 97% 99%   Vitals:   06/15/17 0643 06/15/17 1300 06/15/17 2233 06/16/17 0449  BP: (!) 152/95 (!) 155/85 (!) 153/81 (!) 153/87  Pulse: 66 68 66 79  Resp: Temp: 99 F (37.2 C)  98.4 F (36.9 C) 97.9 F (36.6 C)  TempSrc: Oral Oral Oral Oral  SpO2: 98% 97% 97% 99%  Weight:      Height:        General: Pt is alert, awake, not in acute distress Cardiovascular: RRR, S1/S2 +, no rubs, no gallops Respiratory: CTA bilaterally, no wheezing, no rhonchi Abdominal: Soft, NT, ND, bowel sounds + Extremities: no edema, no cyanosis    The results of significant diagnostics from this hospitalization (including imaging, microbiology, ancillary and laboratory) are listed below for reference.     Microbiology: Recent Results (from the past 240 hour(s))  Blood Culture (routine x 2)     Status: None (Preliminary result)   Collection Time: 06/13/17 10:45 PM  Result Value Ref Range Status   Specimen Description BLOOD RIGHT ANTECUBITAL  Final   Special Requests   Final    BOTTLES DRAWN AEROBIC AND ANAEROBIC Blood Culture adequate volume   Culture   Final    NO GROWTH 1 DAY Performed at [facility], [address] [place], [place] [address]    Report Status PENDING  Incomplete  Blood Culture (routine x 2)     Status: None (Preliminary result)   Collection Time: 06/13/17 10:45 PM  Result Value Ref Range Status   Specimen Description BLOOD LEFT ANTECUBITAL  Final   Special Requests   Final    BOTTLES DRAWN AEROBIC AND ANAEROBIC Blood Culture  adequate volume   Culture   Final    NO GROWTH 1 DAY Performed at [facility], [address] [place], [place] [address]    Report Status PENDING  Incomplete     Labs: BNP (last 3 results) No results for input(s): BNP in the last 8760 hours. Basic Metabolic Panel:  Recent Labs Lab 06/13/17 2138 06/14/17 0733 06/15/17 0351  NA 126* 134* 140  K 3.3* 3.1* 3.8  CL 90* 96* 107  CO2 21* 25 25  GLUCOSE 110* 101* 88  BUN <5* <5* <5*  CREATININE 0.64 0.63 0.70  CALCIUM 8.9 9.1 8.7*   Liver Function Tests:  Recent Labs Lab 06/13/17 2138  AST 44*  ALT 31  ALKPHOS 93  BILITOT 1.1  PROT 8.2*  ALBUMIN 4.5    Recent Labs Lab 06/13/17 2138  LIPASE 23   No results for input(s): AMMONIA in the last 168 hours. CBC:  Recent Labs Lab 06/13/17 2138 06/14/17 0733 06/15/17 0351  WBC 15.7* 9.7 5.6  NEUTROABS 12.9*  --   --   HGB 13.4 13.2 12.1  HCT 38.6 38.0 35.9*  MCV 91.3 89.4 91.8  PLT 258 224 196   Cardiac Enzymes:  Recent Labs Lab 06/13/17 2138  TROPONINI <0.03   BNP: Invalid input(s): POCBNP CBG: No results for input(s): GLUCAP in the last 168 hours. D-Dimer No results for input(s): DDIMER in the last 72 hours. Hgb A1c No results for input(s):  HGBA1C in the last 72 hours. Lipid Profile No results for input(s): CHOL, HDL, LDLCALC, TRIG, CHOLHDL, LDLDIRECT in the last 72 hours. Thyroid function studies No results for input(s): TSH, T4TOTAL, T3FREE, THYROIDAB in the last 72 hours.  Invalid input(s): FREET3 Anemia work up No results for input(s): VITAMINB12, FOLATE, FERRITIN, TIBC, IRON, RETICCTPCT in the last 72 hours. Urinalysis    Component Value Date/Time   COLORURINE YELLOW 06/13/2017 1900   APPEARANCEUR CLEAR 06/13/2017 1900   LABSPEC 1.020 06/13/2017 1900   PHURINE 6.0 06/13/2017 1900   GLUCOSEU NEGATIVE 06/13/2017 1900   HGBUR LARGE (A) 06/13/2017 1900   BILIRUBINUR NEGATIVE 06/13/2017 1900   KETONESUR 40 (A) 06/13/2017 1900    PROTEINUR NEGATIVE 06/13/2017 1900   UROBILINOGEN 0.2 08/14/2011 0309   NITRITE NEGATIVE 06/13/2017 1900   LEUKOCYTESUR NEGATIVE 06/13/2017 1900   Sepsis Labs Invalid input(s): PROCALCITONIN,  WBC,  LACTICIDVEN Microbiology Recent Results (from the past 240 hour(s))  Blood Culture (routine x 2)     Status: None (Preliminary result)   Collection Time: 06/13/17 10:45 PM  Result Value Ref Range Status   Specimen Description BLOOD RIGHT ANTECUBITAL  Final   Special Requests   Final    BOTTLES DRAWN AEROBIC AND ANAEROBIC Blood Culture adequate volume   Culture   Final    NO GROWTH 1 DAY Performed at [facility], [address] [place], [place] [address]    Report Status PENDING  Incomplete  Blood Culture (routine x 2)     Status: None (Preliminary result)   Collection Time: 06/13/17 10:45 PM  Result Value Ref Range Status   Specimen Description BLOOD LEFT ANTECUBITAL  Final   Special Requests   Final    BOTTLES DRAWN AEROBIC AND ANAEROBIC Blood Culture adequate volume   Culture   Final    NO GROWTH 1 DAY Performed at [facility], [address] [place], [place] [address]    Report Status PENDING  Incomplete     Time coordinating discharge: Over 30 minutes  SIGNED:   Alwyn RenElizabeth G Ellyana Crigler, MD  [facility] 06/16/2017, 12:50 PM Pager   If 7PM-7AM, please contact night-coverage [email or web address] Password [email or web address]

## 2017-06-16 NOTE — Progress Notes (Signed)
PROGRESS NOTE    Alexandra Jordan  WUJ:[id number] DOB: 1956-03-13 DOA: 06/13/2017 PCP: Daisy Floro, MD (Confirm with patient/family/NH records and if not entered, this HAS to be entered at [facility] point of entry. "No PCP" if truly none.)   Brief Narrative: 61 year old female admitted with abdominal discomfort and bloating of her abdomen abdominal pain. She has a history of drinking 3 glasses of wine daily. CT scan of the abdomen done in the ER did not reveal anything acute. However she continued to have a lot of gas which caused her to have abdominal discomfort and bloating. Patient was started on Zosyn at the time of admission thinking of sepsis or it was stopped later due to  evidence of no sepsis.    Assessment & Plan:   Active Problems:   Hyponatremia   Pain in the abdomen   SIRS (systemic inflammatory response syndrome) (HCC)  Abdominal pain secondary to excessive gas no acute  intra-abdominal process going on. Patient to follow-up with GI as an outpatient. Follow-up with primary care physician in one week. Patient probably had gastritis secondary to a daily intake of wine. Currently patient being treated with Protonix patient will be discharged on Protonix for a month.  Anxiety will discharge her on Ativan 0.5 mg every 6 when necessary which she was taking at home.  Hyponatremia has been resolved  Hypokalemia has been resolved.      DVT prophylaxis  Code Status: full Family Communication:  Disposition Plan:    Consultants:   Procedures:  Antimicrobials:    Subjective:   Objective: Vitals:   06/15/17 0643 06/15/17 1300 06/15/17 2233 06/16/17 0449  BP: (!) 152/95 (!) 155/85 (!) 153/81 (!) 153/87  Pulse: 66 68 66 79  Resp: Temp: 99 F (37.2 C)  98.4 F (36.9 C) 97.9 F (36.6 C)  TempSrc: Oral Oral Oral Oral  SpO2: 98% 97% 97% 99%  Weight:      Height:        Intake/Output Summary (Last 24 hours) at 06/16/17 0843 Last data filed at  06/15/17 1700  Gross per 24 hour  Intake              240 ml  Output                0 ml  Net              240 ml   Filed Weights   06/13/17 1755 06/14/17 0450  Weight: 69.3 kg (152 lb 12.5 oz) 67 kg (147 lb 11.3 oz)    Examination:  General exam: Appears calm and comfortable  Respiratory system: Clear to auscultation. Respiratory effort normal. Cardiovascular system: S1 & S2 heard, RRR. No JVD, murmurs, rubs, gallops or clicks. No pedal edema. Gastrointestinal system: Abdomen is nondistended, soft and nontender. No organomegaly or masses felt. Normal bowel sounds heard. Central nervous system: Alert and oriented. No focal neurological deficits. Extremities: Symmetric 5 x 5 power. Skin: No rashes, lesions or ulcers Psychiatry: Judgement and insight appear normal. Mood & affect appropriate.     Data Reviewed: I have personally reviewed following labs and imaging studies  CBC:  Recent Labs Lab 06/13/17 2138 06/14/17 0733 06/15/17 0351  WBC 15.7* 9.7 5.6  NEUTROABS 12.9*  --   --   HGB 13.4 13.2 12.1  HCT 38.6 38.0 35.9*  MCV 91.3 89.4 91.8  PLT 258 224 196   Basic Metabolic Panel:  Recent Labs Lab 06/13/17  2138 06/14/17 0733 06/15/17 0351  NA 126* 134* 140  K 3.3* 3.1* 3.8  CL 90* 96* 107  CO2 21* 25 25  GLUCOSE 110* 101* 88  BUN <5* <5* <5*  CREATININE 0.64 0.63 0.70  CALCIUM 8.9 9.1 8.7*   GFR: Estimated Creatinine Clearance: 70.4 mL/min (by C-G formula based on SCr of 0.7 mg/dL). Liver Function Tests:  Recent Labs Lab 06/13/17 2138  AST 44*  ALT 31  ALKPHOS 93  BILITOT 1.1  PROT 8.2*  ALBUMIN 4.5    Recent Labs Lab 06/13/17 2138  LIPASE 23   No results for input(s): AMMONIA in the last 168 hours. Coagulation Profile: No results for input(s): INR, PROTIME in the last 168 hours. Cardiac Enzymes:  Recent Labs Lab 06/13/17 2138  TROPONINI <0.03   BNP (last 3 results) No results for input(s): PROBNP in the last 8760 hours. HbA1C: No  results for input(s): HGBA1C in the last 72 hours. CBG: No results for input(s): GLUCAP in the last 168 hours. Lipid Profile: No results for input(s): CHOL, HDL, LDLCALC, TRIG, CHOLHDL, LDLDIRECT in the last 72 hours. Thyroid Function Tests: No results for input(s): TSH, T4TOTAL, FREET4, T3FREE, THYROIDAB in the last 72 hours. Anemia Panel: No results for input(s): VITAMINB12, FOLATE, FERRITIN, TIBC, IRON, RETICCTPCT in the last 72 hours. Sepsis Labs:  Recent Labs Lab 06/13/17 2138 06/13/17 2201 06/14/17 0124  PROCALCITON <0.10  --   --   LATICACIDVEN  --  3.30* 1.77    Recent Results (from the past 240 hour(s))  Blood Culture (routine x 2)     Status: None (Preliminary result)   Collection Time: 06/13/17 10:45 PM  Result Value Ref Range Status   Specimen Description BLOOD RIGHT ANTECUBITAL  Final   Special Requests   Final    BOTTLES DRAWN AEROBIC AND ANAEROBIC Blood Culture adequate volume   Culture   Final    NO GROWTH 1 DAY Performed at [facility], [address] [place], [place] [address]    Report Status PENDING  Incomplete  Blood Culture (routine x 2)     Status: None (Preliminary result)   Collection Time: 06/13/17 10:45 PM  Result Value Ref Range Status   Specimen Description BLOOD LEFT ANTECUBITAL  Final   Special Requests   Final    BOTTLES DRAWN AEROBIC AND ANAEROBIC Blood Culture adequate volume   Culture   Final    NO GROWTH 1 DAY Performed at [facility], [address] [place], [place] [address]    Report Status PENDING  Incomplete         Radiology Studies: No results found.      Scheduled Meds: . folic acid  1 mg Oral Daily  . pantoprazole  40 mg Oral BID  . simethicone  240 mg Oral BID  . thiamine  100 mg Oral Daily   Continuous Infusions:   LOS: 2 days      Alwyn RenElizabeth G Mathews, MD [facility]  If 7PM-7AM, please contact night-coverage [email or web address] Password [email or web address] 06/16/2017, 8:43 AM

## 2017-06-19 LAB — CULTURE, BLOOD (ROUTINE X 2)
CULTURE: NO GROWTH
Culture: NO GROWTH
SPECIAL REQUESTS: ADEQUATE
Special Requests: ADEQUATE

## 2019-03-31 IMAGING — CT CT ABD-PELV W/ CM
2 of 6 series · 13 of 36 positions shown, 16 images · IV contrast (APPLIED)
Comparison: 09/19/2011 CT of chest. 08/13/2011 CT abdomen and
pelvis.

CLINICAL DATA: 60 y/o F; epigastric abdominal pain and distention
for 1 week. Elevated lactic acid.

EXAM:
CT CHEST, ABDOMEN, AND PELVIS WITH CONTRAST
TECHNIQUE: Multidetector CT imaging of the chest, abdomen and pelvis was
performed following the standard protocol during bolus
administration of intravenous contrast.
CONTRAST:  100mL XFE30S-FEE IOPAMIDOL (XFE30S-FEE) INJECTION 61%

[Series 4: coronals · coronal · 0.75mm/px · 3 of 121 slices shown]
[im 25/121  lung]
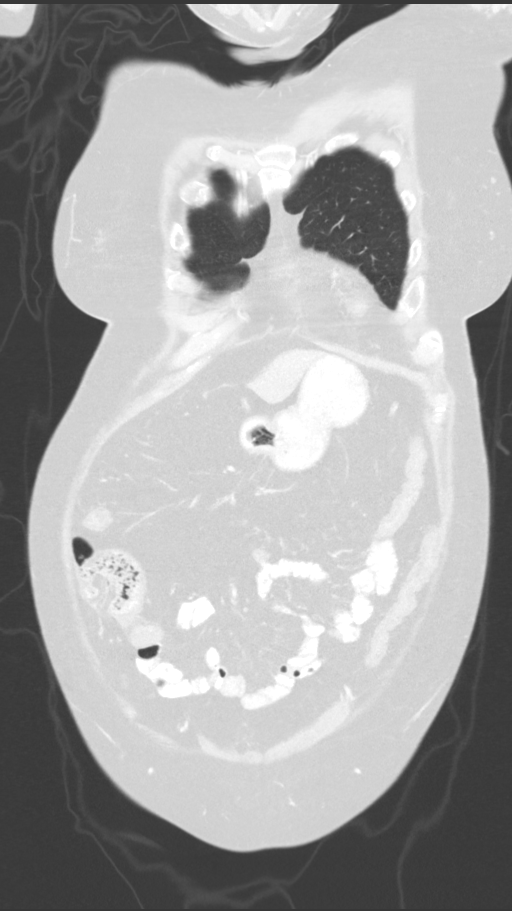
[im 49/121  lung]
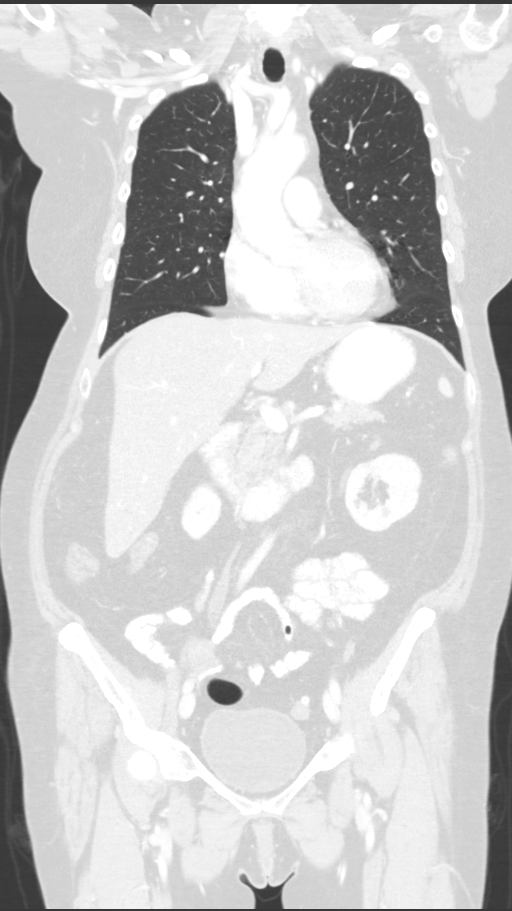
[im 73/121  lung]
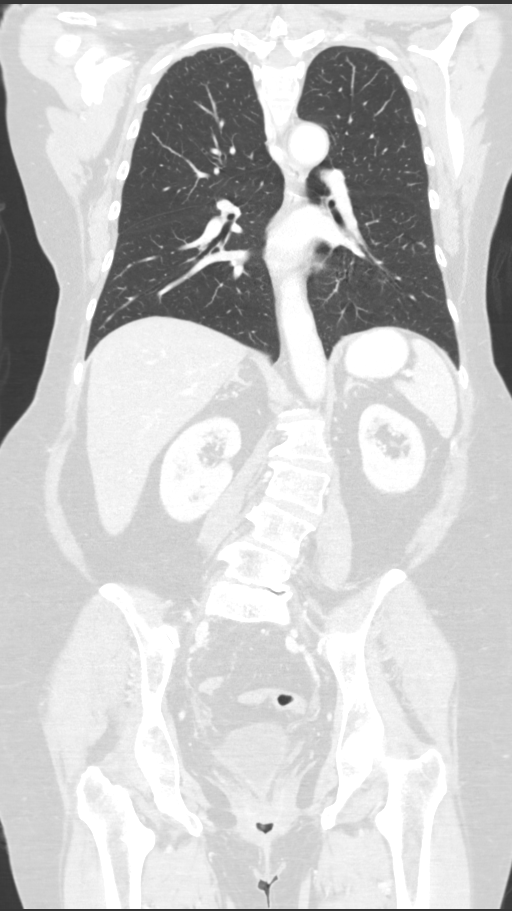

[Series 7: thins · axial · 0.84mm/px · z∈[-463,+134]mm · 10 of 955 slices shown, 13 images]
[im 51/955  mediastinal]
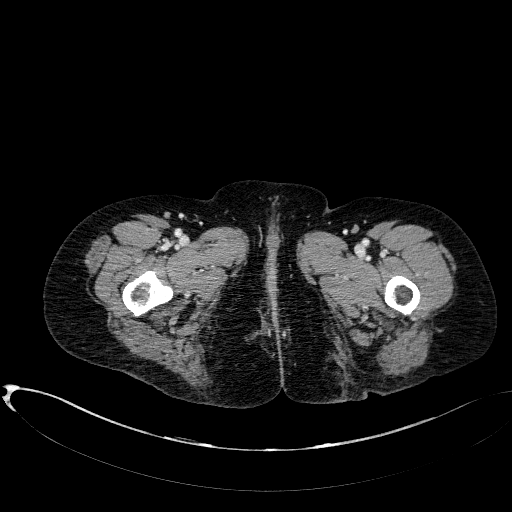
[im 51/955  lung]
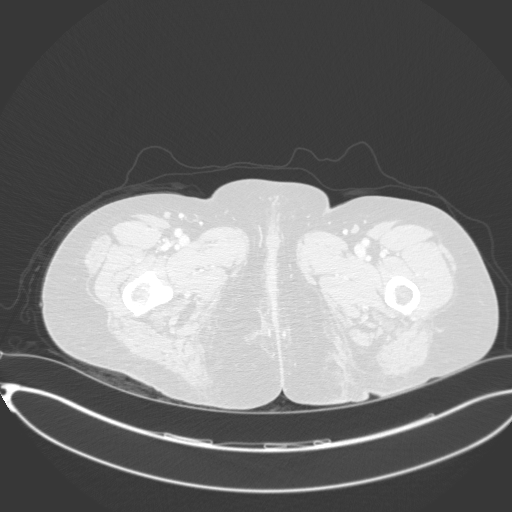
[im 151/955  lung]
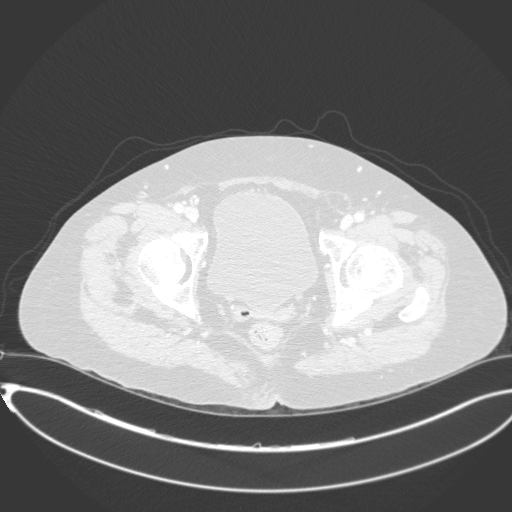
[im 252/955  lung]
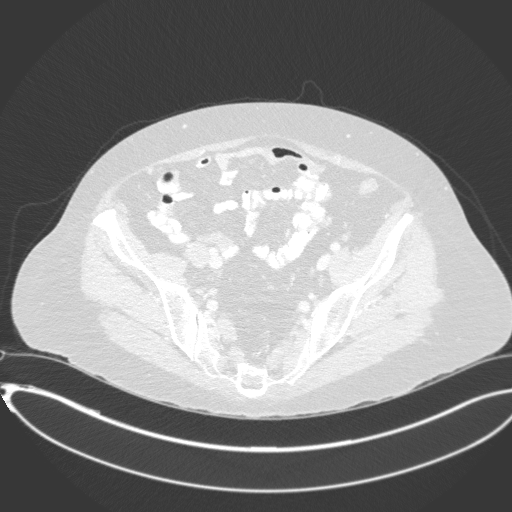
[im 352/955  lung]
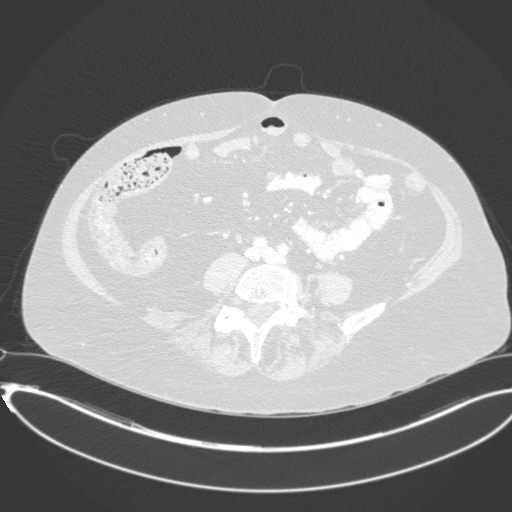
[im 452/955  mediastinal]
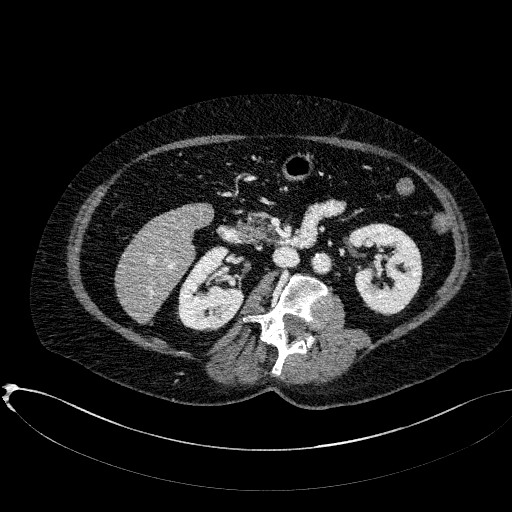
[im 452/955  lung]
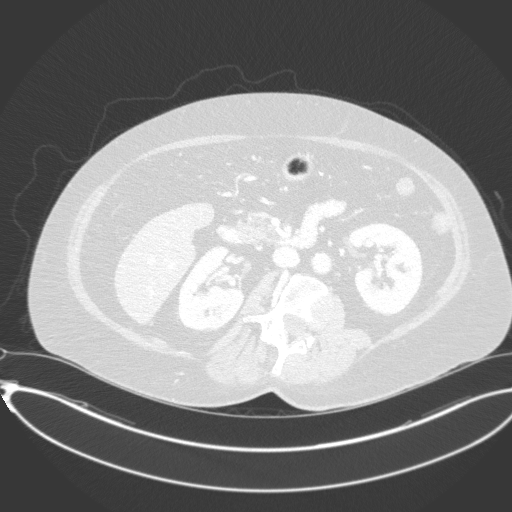
[im 503/955  lung]
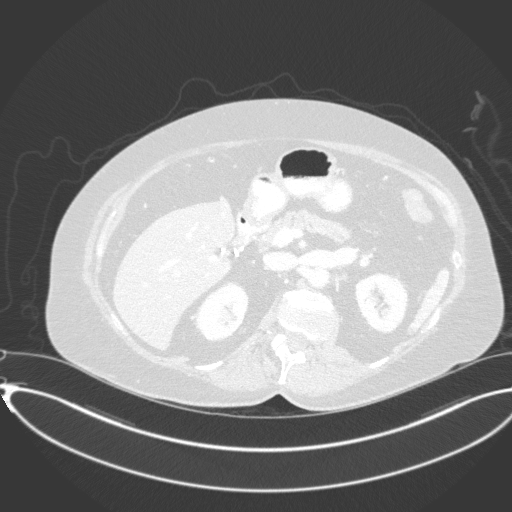
[im 603/955  lung]
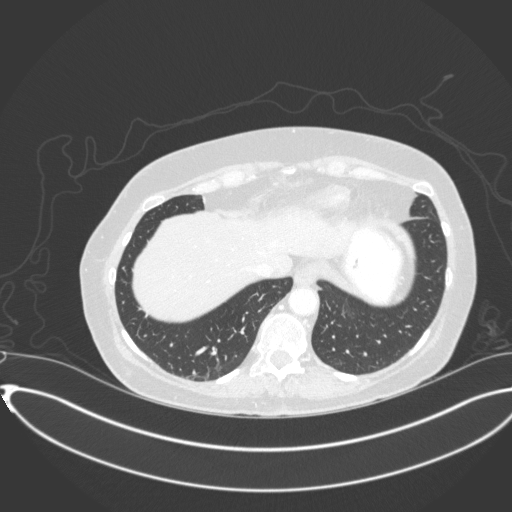
[im 703/955  lung]
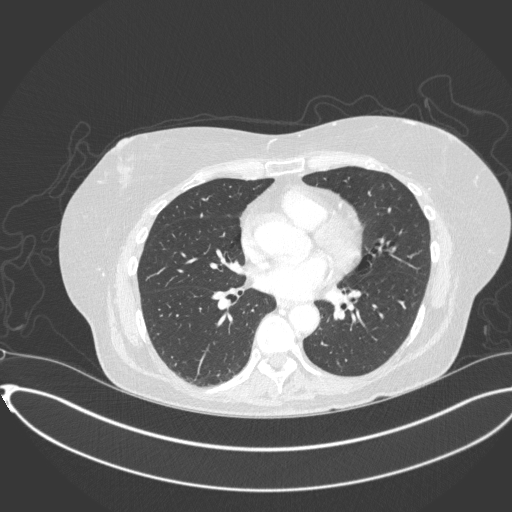
[im 804/955  mediastinal]
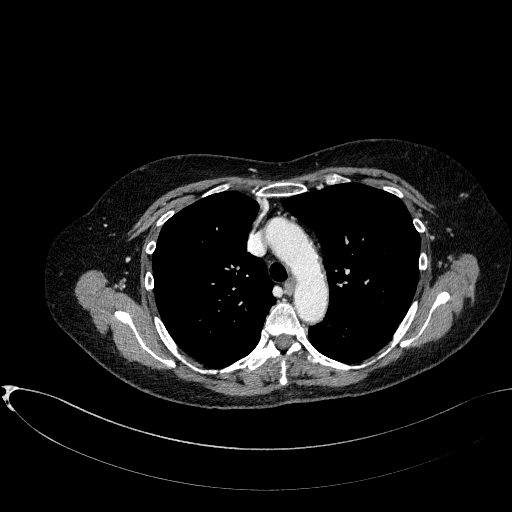
[im 804/955  lung]
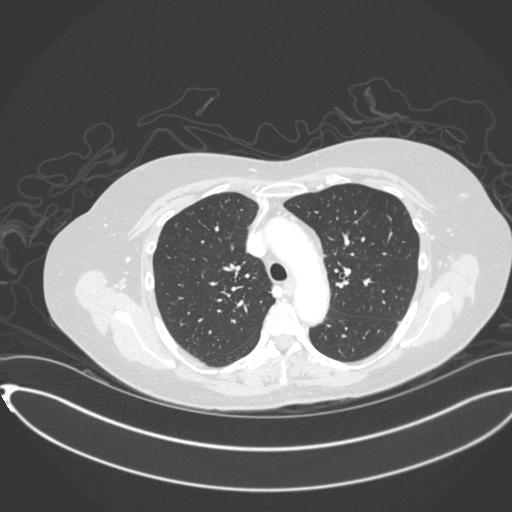
[im 904/955  lung]
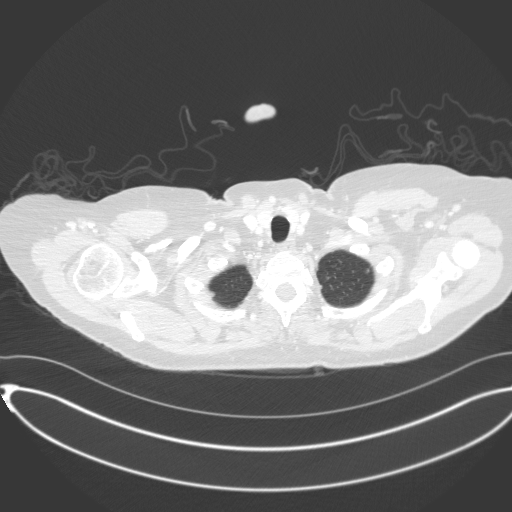

[13 of 36 positions shown; findings below may reference images not displayed]

FINDINGS: CT CHEST FINDINGS

Cardiovascular: No significant vascular findings. Normal heart size.
No pericardial effusion. Normal caliber thoracic aorta with mild
calcific atherosclerosis.

Mediastinum/Nodes: No enlarged mediastinal, hilar, or axillary lymph
nodes. Thyroid gland, trachea, and esophagus demonstrate no
significant findings.

Lungs/Pleura: Right upper lobe pulmonary nodule is minimally
increased in size measuring 7 mm, previously 6 mm (series 3, image
49). The new 5 mm right lower lobe pulmonary nodule (series 3, image
135). No consolidation, effusion, or pneumothorax.

Musculoskeletal: No chest wall mass or suspicious bone lesions
identified.

CT ABDOMEN PELVIS FINDINGS

Hepatobiliary: No focal liver abnormality is seen. Hepatic
steatosis. Status post cholecystectomy. No biliary dilatation.

Pancreas: Unremarkable. No pancreatic ductal dilatation or
surrounding inflammatory changes.

Spleen: Normal in size without focal abnormality.

Adrenals/Urinary Tract: Adrenal glands are unremarkable.
Subcentimeter lucencies in the kidneys bilaterally likely
representing cysts. Kidneys are otherwise normal, without renal
calculi, focal lesion, or hydronephrosis. Bladder is unremarkable.

Stomach/Bowel: Stomach is within normal limits. Appendix appears
normal. No evidence of bowel wall thickening, distention, or
inflammatory changes. Scattered sigmoid diverticulosis.

Vascular/Lymphatic: Aortic atherosclerosis with mild calcification.
No enlarged abdominal or pelvic lymph nodes.

Reproductive: Status post hysterectomy. No adnexal masses.

Other: No abdominal wall hernia or abnormality. No abdominopelvic
ascites.

Musculoskeletal: No fracture is seen. Moderate lumbar levocurvature.
IMPRESSION: 1. No acute process identified.
2. Right upper lobe pulmonary nodule is stable to minimally
increased in size from 9199 measuring 7 mm, probably benign given
long interval.
3. New 5 mm right lower lobe pulmonary nodule. No follow-up needed
if patient is low-risk. Non-contrast chest CT can be considered in
12 months if patient is high-risk. This recommendation follows the
consensus statement: Guidelines for Management of Incidental
Pulmonary Nodules Detected on CT Images: From the [HOSPITAL]
4. Hepatic steatosis.
5. Mild aortic atherosclerosis.

By: Guangtao Legreso M.D.

## 2022-02-16 ENCOUNTER — Telehealth: Payer: Self-pay | Admitting: Physician Assistant

## 2022-02-16 NOTE — Telephone Encounter (Signed)
Scheduled appt per 5/9 referral. Pt is aware of appt date and time. Pt is aware to arrive 15 mins prior to appt time and to bring and updated insurance card. Pt is aware of appt location.   ?

## 2022-03-07 NOTE — Progress Notes (Signed)
[place] [phone]   [phone] [place] NOTE  Patient Care Team: Lawerance Cruel, MD as PCP - General (Family Medicine) Gavin Pound, MD (Internal Medicine)  Hematological/Oncological History 1) Labs from PCP, Dr. Melinda Crutch: -01/15/2021: WBC 11.8 (H), Hgb 12.9, Plt 325, ANC 8.6 (H) -08/17/2021: WBC 6.9, Hgb 12.9, Plt 366 -02/10/2022: WBC 14.1 (H), Hgb 14.3, Plt 319, ANC 11.3 (H), CRP 21 (H)  2) 03/08/2022: Establish care with [facility] Hematology  CHIEF COMPLAINTS/PURPOSE OF CONSULTATION:  Neutrophilia  HISTORY OF PRESENTING ILLNESS:  Alexandra Jordan 66 y.o. female presents to the clinic for initial evaluation for neutrophilia. She is unaccompanied for this visit.   On exam today, Alexandra Jordan reports her energy levels are stable. She is able to complete her ADLs on her own. She has a good appetite and denies any weight loss. She denies nausea, vomiting or abdominal pain. She reports her bowel habits are unchanged without any recurrent episodes of diarrhea or constipation. She reports joint pain involving the knees. She reports ankle swelling that presents periodically and improves with elevation of her legs. She denies any fevers, chills, sweats, shortness of breath, chest pain or cough. She has no other complaints. Rest of the 10 point ROS is below.   MEDICAL HISTORY:  Past Medical History:  Diagnosis Date   Acute thoracic back pain    Anemia    Family history of breast cancer    GERD (gastroesophageal reflux disease)    Thoracic aorta injury    Intramural hematoma    SURGICAL HISTORY: Past Surgical History:  Procedure Laterality Date   ABDOMINAL HYSTERECTOMY  2000   CHOLECYSTECTOMY      SOCIAL HISTORY: Social History   Socioeconomic History   Marital status: Married    Spouse name: Not on file   Number of children: Not on file   Years of education: Not on file   Highest education level: Not on file  Occupational  History   Not on file  Tobacco Use   Smoking status: Former    Years: 1.00    Types: Cigarettes    Quit date: 10/20/1979    Years since quitting: 42.4   Smokeless tobacco: Never  Substance and Sexual Activity   Alcohol use: Yes    Alcohol/week: 3.0 standard drinks    Types: 3 Glasses of wine per week   Drug use: No   Sexual activity: Yes  Other Topics Concern   Not on file  Social History Narrative   Not on file   Social Determinants of Health   Financial Resource Strain: Not on file  Food Insecurity: Not on file  Transportation Needs: Not on file  Physical Activity: Not on file  Stress: Not on file  Social Connections: Not on file  Intimate Partner Violence: Not on file    FAMILY HISTORY: Family History  Problem Relation Age of Onset   Cancer Other        Family History of Breast cancer   Cancer Mother    Hyperlipidemia Father    Hypertension Father    Deep vein thrombosis Sister     ALLERGIES:  is allergic to celecoxib and hydrochlorothiazide.  MEDICATIONS:  Current Outpatient Medications  Medication Sig Dispense Refill   docusate sodium (COLACE) 100 MG capsule daily as needed.     LORazepam (ATIVAN) 0.5 MG tablet Take 1 tablet (0.5 mg total) by mouth every 6 (six) hours as needed for anxiety. (Patient taking differently: Take  0.5 mg by mouth daily.) 30 tablet 0   Simethicone 250 MG CAPS Take 250 mg by mouth as needed.     tiZANidine (ZANAFLEX) 2 MG tablet 1-2 tablet as needed     amLODipine (NORVASC) 10 MG tablet Take 10 mg by mouth daily.     escitalopram (LEXAPRO) 10 MG tablet Take 10 mg by mouth daily.     meloxicam (MOBIC) 15 MG tablet Take 15 mg by mouth daily.     pantoprazole (PROTONIX) 40 MG tablet Take 1 tablet (40 mg total) by mouth 2 (two) times daily. (Patient not taking: Reported on 03/08/2022) 30 tablet 0   thiamine 100 MG tablet Take 1 tablet (100 mg total) by mouth daily. (Patient not taking: Reported on 03/08/2022) 30 tablet 0   valsartan  (DIOVAN) 320 MG tablet Take 320 mg by mouth daily.     No current facility-administered medications for this visit.    REVIEW OF SYSTEMS:   Constitutional: ( - ) fevers, ( - )  chills , ( - ) night sweats Eyes: ( - ) blurriness of vision, ( - ) double vision, ( - ) watery eyes Ears, nose, mouth, throat, and face: ( - ) mucositis, ( - ) sore throat Respiratory: ( - ) cough, ( - ) dyspnea, ( - ) wheezes Cardiovascular: ( - ) palpitation, ( - ) chest discomfort, ( - ) lower extremity swelling Gastrointestinal:  ( - ) nausea, ( - ) heartburn, ( - ) change in bowel habits Skin: ( - ) abnormal skin rashes Lymphatics: ( - ) new lymphadenopathy, ( - ) easy bruising Neurological: ( - ) numbness, ( - ) tingling, ( - ) new weaknesses Behavioral/Psych: ( - ) mood change, ( - ) new changes  All other systems were reviewed with the patient and are negative.  PHYSICAL EXAMINATION: ECOG PERFORMANCE STATUS: 1 - Symptomatic but completely ambulatory  There were no vitals filed for this visit. There were no vitals filed for this visit.  GENERAL: well appearing female in NAD  SKIN: skin color, texture, turgor are normal, no rashes or significant lesions EYES: conjunctiva are pink and non-injected, sclera clear OROPHARYNX: no exudate, no erythema; lips, buccal mucosa, and tongue normal  NECK: supple, non-tender LYMPH:  no palpable lymphadenopathy in the cervical or supraclavicular lymph nodes.  LUNGS: clear to auscultation and percussion with normal breathing effort HEART: regular rate & rhythm and no murmurs and no lower extremity edema ABDOMEN: soft, non-tender, non-distended, normal bowel sounds Musculoskeletal: no cyanosis of digits and no clubbing  PSYCH: alert & oriented x 3, fluent speech NEURO: no focal motor/sensory deficits  LABORATORY DATA:  I have reviewed the data as listed    Latest Ref Rng & Units 03/08/2022    3:11 PM 06/15/2017    3:51 AM 06/14/2017    7:33 AM  CBC  WBC 4.0 - 10.5  K/uL 11.3   5.6   9.7    Hemoglobin 12.0 - 15.0 g/dL 14.5   12.1   13.2    Hematocrit 36.0 - 46.0 % 41.8   35.9   38.0    Platelets 150 - 400 K/uL 321   196   224         Latest Ref Rng & Units 03/08/2022    3:11 PM 06/15/2017    3:51 AM 06/14/2017    7:33 AM  CMP  Glucose 70 - 99 mg/dL 90   88   101    BUN  8 - 23 mg/dL 10   <5   <5    Creatinine 0.44 - 1.00 mg/dL 0.61   0.70   0.63    Sodium 135 - 145 mmol/L 134   140   134    Potassium 3.5 - 5.1 mmol/L 3.4   3.8   3.1    Chloride 98 - 111 mmol/L 98   107   96    CO2 22 - 32 mmol/L 27   25   25     Calcium 8.9 - 10.3 mg/dL 9.5   8.7   9.1    Total Protein 6.5 - 8.1 g/dL 8.4      Total Bilirubin 0.3 - 1.2 mg/dL 0.5      Alkaline Phos 38 - 126 U/L 76      AST 15 - 41 U/L 14      ALT 0 - 44 U/L 9        ASSESSMENT & PLAN Alexandra Jordan is a 66 y.o. female who presents to the clinic for evaluation for neutrophilia. We reviewed possible etiologies including inflammatory process, infectious process and bone marrow disorders.   Per review of outside labs, leukocytosis/neutrophilia has oscillated and inflammatory markers are elevated. Patient denies any known inflammatory conditions and has not recently taken steroids. Recommend to proceed with serologic workup to check CBC, CMP, LDH, ESR, CRP and peripheral smear  #Neutrophilia: --Suspect inflammatory process due to prior labs that show rise in c-reactive protein and WBC/ANC oscillate.  --Labs today to check CBC, CMP, LDH, ESR, CRP and peripheral smear --If inflammatory markers are elevated again, we will refer to rheumatology to further evaluate.  --RTC in 6 months unless above workup requires intervention   Orders Placed This Encounter  Procedures   CBC with Differential ([place] Only)    Standing Status:   Future    Number of Occurrences:   1    Standing Expiration Date:   03/08/2023   CMP ([place] only)    Standing Status:   Future    Number of Occurrences:   1     Standing Expiration Date:   03/08/2023   Sedimentation rate    Standing Status:   Future    Number of Occurrences:   1    Standing Expiration Date:   03/07/2023   C-reactive protein    Standing Status:   Future    Number of Occurrences:   1    Standing Expiration Date:   03/07/2023   Save Smear [facility])    Standing Status:   Future    Number of Occurrences:   1    Standing Expiration Date:   03/08/2023    All questions were answered. The patient knows to call the clinic with any problems, questions or concerns.  I have spent a total of 60 minutes minutes of face-to-face and non-face-to-face time, preparing to see the patient, obtaining and/or reviewing separately obtained history, performing a medically appropriate examination, counseling and educating the patient, ordering tests, referring and communicating with other health care professionals, documenting clinical information in the electronic health record,and care coordination.   Dede Query, PA-C Department of Hematology/Oncology [place] at [facility] Phone: [phone]  Patient was seen with Dr. Lorenso Courier  I have read the above note and personally examined the patient. I agree with the assessment and plan as noted above.  Briefly Alexandra Jordan is a 66 year old female who presents for evaluation of neutrophilia.  The  patient has mildly elevated white blood cell count of unclear etiology.  At this time suspect inflammatory etiology.  We will order inflammatory markers with ESR and CRP.  If these are found to be elevated we will make referral to rheumatology for further evaluation.  Patient is a non-smoker and does not have any other hematological abnormalities.   Ledell Peoples, MD Department of Hematology/Oncology [place] at [facility] Phone: [phone] Pager: [phone] Email: [email or web address]

## 2022-03-08 ENCOUNTER — Inpatient Hospital Stay: Payer: 59 | Attending: Physician Assistant | Admitting: Physician Assistant

## 2022-03-08 ENCOUNTER — Inpatient Hospital Stay: Payer: 59

## 2022-03-08 ENCOUNTER — Other Ambulatory Visit: Payer: Self-pay

## 2022-03-08 DIAGNOSIS — Z87891 Personal history of nicotine dependence: Secondary | ICD-10-CM

## 2022-03-08 DIAGNOSIS — D729 Disorder of white blood cells, unspecified: Secondary | ICD-10-CM | POA: Diagnosis not present

## 2022-03-08 LAB — CBC WITH DIFFERENTIAL (CANCER CENTER ONLY)
Abs Immature Granulocytes: 0.04 10*3/uL (ref 0.00–0.07)
Basophils Absolute: 0.1 10*3/uL (ref 0.0–0.1)
Basophils Relative: 1 %
Eosinophils Absolute: 0.1 10*3/uL (ref 0.0–0.5)
Eosinophils Relative: 1 %
HCT: 41.8 % (ref 36.0–46.0)
Hemoglobin: 14.5 g/dL (ref 12.0–15.0)
Immature Granulocytes: 0 %
Lymphocytes Relative: 20 %
Lymphs Abs: 2.3 10*3/uL (ref 0.7–4.0)
MCH: 32.2 pg (ref 26.0–34.0)
MCHC: 34.7 g/dL (ref 30.0–36.0)
MCV: 92.7 fL (ref 80.0–100.0)
Monocytes Absolute: 0.5 10*3/uL (ref 0.1–1.0)
Monocytes Relative: 4 %
Neutro Abs: 8.3 10*3/uL — ABNORMAL HIGH (ref 1.7–7.7)
Neutrophils Relative %: 74 %
Platelet Count: 321 10*3/uL (ref 150–400)
RBC: 4.51 MIL/uL (ref 3.87–5.11)
RDW: 12.3 % (ref 11.5–15.5)
WBC Count: 11.3 10*3/uL — ABNORMAL HIGH (ref 4.0–10.5)
nRBC: 0 % (ref 0.0–0.2)

## 2022-03-08 LAB — CMP (CANCER CENTER ONLY)
ALT: 9 U/L (ref 0–44)
AST: 14 U/L — ABNORMAL LOW (ref 15–41)
Albumin: 4.4 g/dL (ref 3.5–5.0)
Alkaline Phosphatase: 76 U/L (ref 38–126)
Anion gap: 9 (ref 5–15)
BUN: 10 mg/dL (ref 8–23)
CO2: 27 mmol/L (ref 22–32)
Calcium: 9.5 mg/dL (ref 8.9–10.3)
Chloride: 98 mmol/L (ref 98–111)
Creatinine: 0.61 mg/dL (ref 0.44–1.00)
GFR, Estimated: >60 mL/min (ref >60)
Glucose, Bld: 90 mg/dL (ref 70–99)
Potassium: 3.4 mmol/L — ABNORMAL LOW (ref 3.5–5.1)
Sodium: 134 mmol/L — ABNORMAL LOW (ref 135–145)
Total Bilirubin: 0.5 mg/dL (ref 0.3–1.2)
Total Protein: 8.4 g/dL — ABNORMAL HIGH (ref 6.5–8.1)

## 2022-03-08 LAB — SEDIMENTATION RATE: Sed Rate: 65 mm/hr — ABNORMAL HIGH (ref 0–22)

## 2022-03-08 LAB — SAVE SMEAR(SSMR), FOR PROVIDER SLIDE REVIEW

## 2022-03-08 LAB — C-REACTIVE PROTEIN: CRP: 0.8 mg/dL (ref <1.0)

## 2022-03-10 ENCOUNTER — Telehealth: Payer: Self-pay | Admitting: Physician Assistant

## 2022-03-10 DIAGNOSIS — D729 Disorder of white blood cells, unspecified: Secondary | ICD-10-CM | POA: Insufficient documentation

## 2022-03-10 NOTE — Telephone Encounter (Signed)
Scheduled per 6/1 secure chat, pt confirmed appt

## 2022-03-10 NOTE — Telephone Encounter (Signed)
I called [name] to review the lab results from 03/08/2022. Findings show improving leukocytosis with WBC 11.3, ANC 8.3. There is elevation of sed rate to suggest inflammatory process. Recommend a referral to rheumatology for further workup of inflammatory conditions.   We will plan to see patient back in 6 months to monitor labs. She expressed understanding and satisfaction with the plan provided

## 2022-03-10 NOTE — Telephone Encounter (Signed)
Referral faxed to [facility] Rheumatology.  Confirmation received

## 2022-04-14 ENCOUNTER — Ambulatory Visit: Payer: 59 | Admitting: Cardiology

## 2022-04-14 ENCOUNTER — Encounter: Payer: Self-pay | Admitting: Interventional Cardiology

## 2022-04-14 ENCOUNTER — Ambulatory Visit (INDEPENDENT_AMBULATORY_CARE_PROVIDER_SITE_OTHER): Payer: 59 | Admitting: Interventional Cardiology

## 2022-04-14 VITALS — BP 112/70 | HR 83 | Ht 65.5 in | Wt 118.0 lb

## 2022-04-14 DIAGNOSIS — R011 Cardiac murmur, unspecified: Secondary | ICD-10-CM

## 2022-04-14 DIAGNOSIS — I10 Essential (primary) hypertension: Secondary | ICD-10-CM | POA: Diagnosis not present

## 2022-04-14 DIAGNOSIS — I7102 Dissection of abdominal aorta: Secondary | ICD-10-CM | POA: Diagnosis not present

## 2022-04-14 NOTE — Progress Notes (Signed)
Cardiology Office Note   Date:  04/14/2022   ID:  Alexandra Jordan, DOB 1955-10-21, MRN [id number]  PCP:  Lawerance Cruel, MD    No chief complaint on file.  murmur  Wt Readings from Last 3 Encounters:  04/14/22 118 lb (53.5 kg)  06/14/17 147 lb 11.3 oz (67 kg)  09/19/11 127 lb (57.6 kg)       History of Present Illness: Alexandra Jordan is a 66 y.o. female who is being seen today for the evaluation of murmur at the request of Lawerance Cruel, MD.   Patient also is being evaluated by hematology for elevated CRP and elevated white blood cell count.  Also referred to rheumatologist.  Cardiac murmur was also noted at the time of her physical with Dr. Harrington Challenger.  Treated for HTN with meds for about 10 years.  BP has been well controlled except for spike in 09/2021 when she had arthritis.  Being checked or an autoimmune issue.    She has severe knee arthritis, bone on bone.  Denies : Chest pain. Dizziness. Leg edema. Nitroglycerin use. Orthopnea. Palpitations. Paroxysmal nocturnal dyspnea. Shortness of breath. Syncope.    Walking is her most strenuous exercise.  She helps with her 74 year old grandchild.  Able to get up a flight of stairs without cardiac sx.  Knee pain limits exercise.   Father had AFib in his 42s.   Died in 15 from stomach issues. No valve problems in the family.     Past Medical History:  Diagnosis Date   Abnormal laboratory test    Abnormal white blood cell count    Acute thoracic back pain    Anemia    Anxiety    Constipation    ESR raised    Family history of breast cancer    GERD (gastroesophageal reflux disease)    Heart murmur    Hepatic steatosis    High cholesterol    HTN (hypertension)    Leukocytosis    OA (osteoarthritis)    Thoracic aorta injury    Intramural hematoma    Past Surgical History:  Procedure Laterality Date   ABDOMINAL HYSTERECTOMY  2000   CHOLECYSTECTOMY       Current Outpatient Medications   Medication Sig Dispense Refill   amLODipine (NORVASC) 10 MG tablet Take 10 mg by mouth daily.     docusate sodium (COLACE) 100 MG capsule daily as needed.     escitalopram (LEXAPRO) 10 MG tablet Take 10 mg by mouth daily.     famotidine (PEPCID) 20 MG tablet Take 20 mg by mouth daily.     LORazepam (ATIVAN) 0.5 MG tablet Take 1 tablet (0.5 mg total) by mouth every 6 (six) hours as needed for anxiety. (Patient taking differently: Take 0.5 mg by mouth daily.) 30 tablet 0   meloxicam (MOBIC) 15 MG tablet Take 15 mg by mouth daily.     Simethicone 250 MG CAPS Take 250 mg by mouth as needed.     tiZANidine (ZANAFLEX) 2 MG tablet 1-2 tablet as needed     valsartan (DIOVAN) 320 MG tablet Take 320 mg by mouth daily.     No current facility-administered medications for this visit.    Allergies:   Celecoxib and Hydrochlorothiazide    Social History:  The patient  reports that she quit smoking about 42 years ago. Her smoking use included cigarettes. She has never used smokeless tobacco. She reports current alcohol use of about 3.0  standard drinks of alcohol per week. She reports that she does not use drugs.   Family History:  The patient's family history includes Cancer in her mother and another family member; Deep vein thrombosis in her sister; Hyperlipidemia in her father; Hypertension in her father.    ROS:  Please see the history of present illness.   Otherwise, review of systems are positive for joint pain.   All other systems are reviewed and negative.    PHYSICAL EXAM: VS:  BP 112/70   Pulse 83   Ht 5' 5.5" (1.664 m)   Wt 118 lb (53.5 kg)   SpO2 98%   BMI 19.34 kg/m  , BMI Body mass index is 19.34 kg/m. GEN: Well nourished, well developed, in no acute distress HEENT: normal Neck: no JVD, carotid bruits, or masses Cardiac: RRR; soft, early systolic murmur, no rubs, or gallops,no edema  Respiratory:  clear to auscultation bilaterally, normal work of breathing GI: soft, nontender,  nondistended, + BS MS: no deformity or atrophy Skin: warm and dry, no rash Neuro:  Strength and sensation are intact Psych: euthymic mood, full affect   EKG:   The ekg ordered today demonstrates normal ECG   Recent Labs: 03/08/2022: ALT 9; BUN 10; Creatinine 0.61; Hemoglobin 14.5; Platelet Count 321; Potassium 3.4; Sodium 134   Lipid Panel No results found for: "CHOL", "TRIG", "HDL", "CHOLHDL", "VLDL", "LDLCALC", "LDLDIRECT"   Other studies Reviewed: Additional studies/ records that were reviewed today with results demonstrating: Total cholesterol 151, HDL 76, LDL 62 in May 2023.   ASSESSMENT AND PLAN:  Cardiac murmur: soft systolic murmur.  No signs of heart failure on exam.  Plan for echocardiogram.  Doubt we will find any severe valvular abnormality based on exam, but would like to see valvular morphology especially in the setting of her diffuse inflammation. Hypertension: Low-salt diet.  Avoid processed foods.  Blood pressure well controlled on current medications.  She is tolerating them well.  Minimal ankle edema at times, but not too inconvenient.  She thought it may be related to the amlodipine.  Would not change medicines at this time.  Tried it increase exercise to the target below as her joints allow.  Water aerobics may be a good option. Prior h/o descending aortic intramural hematoma: Blood pressure control is very important to prevent recurrence.  Finding had resolved on subsequent CT. Plan for calcium scoring CT to assess her risk.  We spoke about healthy diet.  Whole food, plant-based diet.  High-fiber diet.  Avoid processed foods.  Avoid added sugar.  She is willing to do the CT.       Current medicines are reviewed at length with the patient today.  The patient concerns regarding her medicines were addressed.  The following changes have been made:  No change  Labs/ tests ordered today include: ech No orders of the defined types were placed in this  encounter.   Recommend 150 minutes/week of aerobic exercise Low fat, low carb, high fiber diet recommended  Disposition:   FU in based on test results   Signed, Larae Grooms, MD  04/14/2022 9:27 AM    [facility] [address], [place], [place]  [address] Phone: [phone]; Fax: [phone]

## 2022-04-14 NOTE — Patient Instructions (Signed)
Medication Instructions:  Your physician recommends that you continue on your current medications as directed. Please refer to the Current Medication list given to you today.  *If you need a refill on your cardiac medications before your next appointment, please call your pharmacy*  Lab Work: If you have labs (blood work) drawn today and your tests are completely normal, you will receive your results only by: MyChart Message (if you have MyChart) OR A paper copy in the mail If you have any lab test that is abnormal or we need to change your treatment, we will call you to review the results.  Testing/Procedures: Your physician has requested that you have an echocardiogram. Echocardiography is a painless test that uses sound waves to create images of your heart. It provides your doctor with information about the size and shape of your heart and how well your heart's chambers and valves are working. This procedure takes approximately one hour. There are no restrictions for this procedure.  Cardiac CT scanning for calcium score, (CAT scanning), is a noninvasive, special x-ray that produces cross-sectional images of the body using x-rays and a computer. CT scans help physicians diagnose and treat medical conditions. For some CT exams, a contrast material is used to enhance visibility in the area of the body being studied. CT scans provide greater clarity and reveal more details than regular x-ray exams.  Follow-Up: At [facility], you and your health needs are our priority.  As part of our continuing mission to provide you with exceptional heart care, we have created designated Provider Care Teams.  These Care Teams include your primary Cardiologist (physician) and Advanced Practice Providers (APPs -  Physician Assistants and Nurse Practitioners) who all work together to provide you with the care you need, when you need it.  We recommend signing up for the patient portal called "MyChart".  Sign up  information is provided on this After Visit Summary.  MyChart is used to connect with patients for Virtual Visits (Telemedicine).  Patients are able to view lab/test results, encounter notes, upcoming appointments, etc.  Non-urgent messages can be sent to your provider as well.   To learn more about what you can do with MyChart, go to [email or web address].    Your next appointment:   As needed if test are okay  The format for your next appointment:   In Person  Provider:   Lance Muss, MD {   Important Information About Sugar

## 2022-04-20 ENCOUNTER — Other Ambulatory Visit (HOSPITAL_BASED_OUTPATIENT_CLINIC_OR_DEPARTMENT_OTHER): Payer: 59

## 2022-05-02 ENCOUNTER — Ambulatory Visit (HOSPITAL_COMMUNITY): Payer: 59 | Attending: Cardiology

## 2022-05-02 DIAGNOSIS — I7102 Dissection of abdominal aorta: Secondary | ICD-10-CM | POA: Insufficient documentation

## 2022-05-02 DIAGNOSIS — I10 Essential (primary) hypertension: Secondary | ICD-10-CM | POA: Diagnosis not present

## 2022-05-02 DIAGNOSIS — R011 Cardiac murmur, unspecified: Secondary | ICD-10-CM | POA: Insufficient documentation

## 2022-05-02 LAB — ECHOCARDIOGRAM COMPLETE
Area-P 1/2: 2.85 cm2
S' Lateral: 1.95 cm

## 2022-05-04 ENCOUNTER — Telehealth: Payer: Self-pay | Admitting: Interventional Cardiology

## 2022-05-04 NOTE — Telephone Encounter (Signed)
Pt informed of echo result. Patient verbalized understanding and agreeable to plan.

## 2022-05-04 NOTE — Telephone Encounter (Signed)
Pt called about her echo, she asked for Charlotte Crumb, Dr. Eldridge Dace RN. Informed her she was out of the office today. She would like to speak to someone. Please advise.

## 2022-05-05 ENCOUNTER — Ambulatory Visit (HOSPITAL_BASED_OUTPATIENT_CLINIC_OR_DEPARTMENT_OTHER)
Admission: RE | Admit: 2022-05-05 | Discharge: 2022-05-05 | Disposition: A | Discharge status: Home / Self Care | Payer: 59 | Source: Ambulatory Visit | Attending: Interventional Cardiology | Admitting: Interventional Cardiology

## 2022-05-05 DIAGNOSIS — R011 Cardiac murmur, unspecified: Secondary | ICD-10-CM | POA: Insufficient documentation

## 2022-05-05 DIAGNOSIS — I7102 Dissection of abdominal aorta: Secondary | ICD-10-CM | POA: Insufficient documentation

## 2022-05-05 DIAGNOSIS — I10 Essential (primary) hypertension: Secondary | ICD-10-CM | POA: Insufficient documentation

## 2022-05-06 ENCOUNTER — Telehealth: Payer: Self-pay | Admitting: *Deleted

## 2022-05-06 NOTE — Telephone Encounter (Signed)
-----   Message from Corky Crafts, MD sent at 05/06/2022  2:10 PM EDT ----- Mildly elevated calcium score with aortic atherosclerosis.  WOuld start rosuvastatin 10 mg daily.  Recheck lipids and liver tests in 2-3 months.

## 2022-05-06 NOTE — Telephone Encounter (Signed)
I spoke with patient and reviewed results with her.  She would like an office visit to discuss results with Dr Eldridge Dace before starting Rosuvastatin.  Appointment made for June 10, 2022 at 3:00

## 2022-06-09 NOTE — Progress Notes (Signed)
Cardiology Office Note   Date:  06/10/2022   ID:  Alexandra Jordan, DOB 10/18/1955, MRN [id number]  PCP:  Alexandra Cruel, MD    No chief complaint on file.  Coronary calcification  Wt Readings from Last 3 Encounters:  06/10/22 122 lb 3.2 oz (55.4 kg)  04/14/22 118 lb (53.5 kg)  06/14/17 147 lb 11.3 oz (67 kg)       History of Present Illness: Alexandra Jordan is a 66 y.o. female with coronary calcification. 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   2012: "stable descending thoracic aortic hypodense wall thickening without  associated aneurysm or dissection. Appearance is consistent with  previous intramural hematoma and other considerations would include  aortitis/vasculitis. "   Patient also is being evaluated by hematology for elevated CRP and elevated white blood cell count.  Also referred to rheumatologist.   Cardiac murmur was also noted at the time of her physical with Dr. Harrington Challenger.   Treated for HTN with meds for about 10 years.  BP has been well controlled except for spike in 09/2021 when she had arthritis.  Being checked or an autoimmune issue.     She has severe knee arthritis, bone on bone. Father had AFib in his 29s.   Died in 64 from stomach issues. No valve problems in the family.  Coroanry calcium scoring CT showed: "Ascending Aorta: Evidence of mild ascending aortic dilation, 40 mm, on non-contrasted study.   Aortic atherosclerosis.   Aortic Valve Calcium score: 0   Mitral annular calcification: None   Pericardium: Normal.   Coronary arteries: Normal origins.   Coronary Calcium Score:   Left main: 0   Left anterior descending artery: 28   Left circumflex artery: 0   Right coronary artery: 88   Total: 116   Percentile: 86th for age, sex, and race matched control.   IMPRESSION: 1. Coronary calcium score of 116. This was 86th percentile for age, gender, and race matched controls.   2. Aortic atherosclerosis.   3.  Evidence of mild ascending aortic dilation, 40 mm, on non-contrasted study. Consider secondary imaging modality (echocardiogram, CTA Aorta Protocol, MRA Aorta Protocol) if clinically indicated."   Past Medical History:  Diagnosis Date   Abnormal laboratory test    Abnormal white blood cell count    Acute thoracic back pain    Anemia    Anxiety    Constipation    ESR raised    Family history of breast cancer    GERD (gastroesophageal reflux disease)    Heart murmur    Hepatic steatosis    High cholesterol    HTN (hypertension)    Leukocytosis    OA (osteoarthritis)    Thoracic aorta injury    Intramural hematoma    Past Surgical History:  Procedure Laterality Date   ABDOMINAL HYSTERECTOMY  2000   CHOLECYSTECTOMY       Current Outpatient Medications  Medication Sig Dispense Refill   amLODipine (NORVASC) 10 MG tablet Take 10 mg by mouth daily.     docusate sodium (COLACE) 100 MG capsule daily as needed.     escitalopram (LEXAPRO) 10 MG tablet Take 10 mg by mouth daily.     famotidine (PEPCID) 20 MG tablet Take 20 mg by mouth daily.     LORazepam (ATIVAN) 0.5 MG tablet Take 1 tablet (0.5 mg total) by mouth every 6 (six) hours as needed for anxiety. (Patient taking differently: Take 0.5 mg by mouth daily.) 30  tablet 0   meloxicam (MOBIC) 15 MG tablet Take 15 mg by mouth as needed.     Simethicone 250 MG CAPS Take 250 mg by mouth as needed.     tiZANidine (ZANAFLEX) 2 MG tablet 1-2 tablet as needed     valsartan (DIOVAN) 320 MG tablet Take 320 mg by mouth daily.     No current facility-administered medications for this visit.    Allergies:   Celecoxib and Hydrochlorothiazide    Social History:  The patient  reports that she quit smoking about 42 years ago. Her smoking use included cigarettes. She has never used smokeless tobacco. She reports current alcohol use of about 3.0 standard drinks of alcohol per week. She reports that she does not use drugs.   Family History:   The patient's family history includes Cancer in her mother and another family member; Deep vein thrombosis in her sister; Hyperlipidemia in her father; Hypertension in her father.    ROS:  Please see the history of present illness.   Otherwise, review of systems are positive for knee pain.   All other systems are reviewed and negative.    PHYSICAL EXAM: VS:  BP 108/64   Pulse 74   Ht 5' 5.5" (1.664 m)   Wt 122 lb 3.2 oz (55.4 kg)   SpO2 99%   BMI 20.03 kg/m  , BMI Body mass index is 20.03 kg/m. GEN: Well nourished, well developed, in no acute distress HEENT: normal Neck: no JVD, carotid bruits, or masses Cardiac: RRR; 2/6 early systolic murmur, no rubs, or gallops,no edema  Respiratory:  clear to auscultation bilaterally, normal work of breathing GI: soft, nontender, nondistended, + BS MS: no deformity or atrophy Skin: warm and dry, no rash Neuro:  Strength and sensation are intact Psych: euthymic mood, full affect   EKG:   The ekg ordered today demonstrates NSR, no ST changes   Recent Labs: 03/08/2022: ALT 9; BUN 10; Creatinine 0.61; Hemoglobin 14.5; Platelet Count 321; Potassium 3.4; Sodium 134   Lipid Panel No results found for: "CHOL", "TRIG", "HDL", "CHOLHDL", "VLDL", "LDLCALC", "LDLDIRECT"   Other studies Reviewed: Additional studies/ records that were reviewed today with results demonstrating: LDL 62, HDL 76, triglycerides 61, total cholesterol 151 in May 2023.   ASSESSMENT AND PLAN:  Coronary artery calcification: Recommend starting lipid lowering therapy. Crestor 10 mg daily despite normal cholesterol.  Lipids and liver tests in 3 months.  HTN: The current medical regimen is effective;  continue present plan and medications.  Healthy diet discussed. Aortic atherosclerosis: Crestor indicated for aortic atherosclerosis as well.  Increase exercise.  She may need to consider water aerobics due to her knee pain. We discussed her echo and her calcium scoring CT in  depth.   Current medicines are reviewed at length with the patient today.  The patient concerns regarding her medicines were addressed.  The following changes have been made:  No change  Labs/ tests ordered today include: As above No orders of the defined types were placed in this encounter.   Recommend 150 minutes/week of aerobic exercise Low fat, low carb, high fiber diet recommended  Disposition:   FU in 1 year   Signed, Larae Grooms, MD  06/10/2022 2:59 PM    [facility] [address], [place], [place]  [address] Phone: [phone]; Fax: [phone]

## 2022-06-10 ENCOUNTER — Encounter: Payer: Self-pay | Admitting: Interventional Cardiology

## 2022-06-10 ENCOUNTER — Ambulatory Visit: Payer: 59 | Attending: Interventional Cardiology | Admitting: Interventional Cardiology

## 2022-06-10 VITALS — BP 108/64 | HR 74 | Ht 65.5 in | Wt 122.2 lb

## 2022-06-10 DIAGNOSIS — I10 Essential (primary) hypertension: Secondary | ICD-10-CM

## 2022-06-10 DIAGNOSIS — I700 Atherosclerosis of aorta: Secondary | ICD-10-CM

## 2022-06-10 DIAGNOSIS — I2510 Atherosclerotic heart disease of native coronary artery without angina pectoris: Secondary | ICD-10-CM | POA: Diagnosis not present

## 2022-06-10 DIAGNOSIS — I2584 Coronary atherosclerosis due to calcified coronary lesion: Secondary | ICD-10-CM

## 2022-06-10 DIAGNOSIS — R011 Cardiac murmur, unspecified: Secondary | ICD-10-CM | POA: Diagnosis not present

## 2022-06-10 MED ORDER — ROSUVASTATIN CALCIUM 10 MG PO TABS: 10.0000 mg | ORAL_TABLET | Freq: Every day | ORAL | 3 refills | Status: AC

## 2022-06-10 NOTE — Patient Instructions (Signed)
Medication Instructions:  Your physician has recommended you make the following change in your medication: Start Rosuvastatin 10 mg by mouth daily   *If you need a refill on your cardiac medications before your next appointment, please call your pharmacy*   Lab Work: Your physician recommends that you return for lab work on September 09, 2022.  Lipid and liver profiles.  This will be fasting.  The lab opens at 7:15 AM  If you have labs (blood work) drawn today and your tests are completely normal, you will receive your results only by: MyChart Message (if you have MyChart) OR A paper copy in the mail If you have any lab test that is abnormal or we need to change your treatment, we will call you to review the results.   Testing/Procedures: none   Follow-Up: At [facility], you and your health needs are our priority.  As part of our continuing mission to provide you with exceptional heart care, we have created designated Provider Care Teams.  These Care Teams include your primary Cardiologist (physician) and Advanced Practice Providers (APPs -  Physician Assistants and Nurse Practitioners) who all work together to provide you with the care you need, when you need it.  We recommend signing up for the patient portal called "MyChart".  Sign up information is provided on this After Visit Summary.  MyChart is used to connect with patients for Virtual Visits (Telemedicine).  Patients are able to view lab/test results, encounter notes, upcoming appointments, etc.  Non-urgent messages can be sent to your provider as well.   To learn more about what you can do with MyChart, go to [email or web address].    Your next appointment:   12 month(s)  The format for your next appointment:   In Person  Provider:   Lance Muss, MD     Other Instructions    Important Information About Sugar

## 2022-06-29 NOTE — Progress Notes (Signed)
Office Visit Note  Patient: Alexandra Jordan             Date of Birth: 1955-10-17           MRN: [id number]             PCP: Lawerance Cruel, MD Referring: Lincoln Brigham, PA-C Visit Date: 06/30/2022   Subjective:  New Patient (Initial Visit) (Abnormal lab results. Joint pain in knees, swelling in ankles. )   History of Present Illness: Alexandra Jordan is a 66 y.o. female here for evaluation of intermittent neutrophilia and sedimentation rate elevation with symptoms of joint pain at multiple areas. She has a history of knee pain related to osteoarthritis that is longstanding. Previous trial of intraarticular steroid injection without symptom improvement.  Symptoms are typically worse with prolonged time on her feet particularly stair climbing or impactful motions.  More recently she has been experiencing some pain and stiffness affecting the hands.  Does have stiffness in the morning but lasting only a few minutes.  She experiences swelling most commonly affecting her ankles without seeing overlying skin changes.  She is not currently on any long-term medication for joint pains. She is not experiencing any unintentional weight loss night sweats lymphadenopathy or new skin rashes. She has a history of isolated aortitis from about 10 years ago.  She had a pretty thorough evaluation including rheumatology work-up at that time that did not identify underlying systemic process.  She did not require prolonged follow-up with rheumatology after that.  Labs reviewed 02/2022 ESR 65 CRP 0.8 CBC WBC 11.3 neutrophils 8.3 CMP NA 134 K 3.4 TP 8.4  Activities of Daily Living:  Patient reports morning stiffness for 5 minutes.   Patient Reports nocturnal pain.  Difficulty dressing/grooming: Reports Difficulty climbing stairs: Reports Difficulty getting out of chair: Denies Difficulty using hands for taps, buttons, cutlery, and/or writing: Denies  Review of Systems  Constitutional:   Negative for fatigue.  HENT:  Negative for mouth sores and mouth dryness.   Eyes:  Negative for dryness.  Respiratory:  Negative for shortness of breath.   Cardiovascular:  Negative for chest pain and palpitations.  Gastrointestinal:  Negative for blood in stool, constipation and diarrhea.  Endocrine: Negative for increased urination.  Genitourinary:  Negative for involuntary urination.  Musculoskeletal:  Positive for joint pain, gait problem, joint pain, joint swelling and morning stiffness. Negative for myalgias, muscle weakness, muscle tenderness and myalgias.  Skin:  Negative for color change, rash and sensitivity to sunlight.  Allergic/Immunologic: Negative for susceptible to infections.  Neurological:  Negative for dizziness and headaches.  Hematological:  Negative for swollen glands.  Psychiatric/Behavioral:  Positive for sleep disturbance. Negative for depressed mood. The patient is nervous/anxious.     PMFS History:  Patient Active Problem List   Diagnosis Date Noted   Osteoarthritis, multiple sites 06/30/2022   Elevated sedimentation rate 06/30/2022   Neutrophilia 03/10/2022   Hyponatremia 06/14/2017   Pain in the abdomen 06/14/2017   SIRS (systemic inflammatory response syndrome) (HCC)    Aortitis ([place]) 08/14/2011   Hypokalemia 08/14/2011   Anemia 08/14/2011   Hematuria 08/14/2011    Past Medical History:  Diagnosis Date   Abnormal laboratory test    Abnormal white blood cell count    Acute thoracic back pain    Anemia    Anxiety    Constipation    ESR raised    Family history of breast cancer    GERD (gastroesophageal reflux disease)  Heart murmur    Hepatic steatosis    High cholesterol    HTN (hypertension)    Leukocytosis    OA (osteoarthritis)    Thoracic aorta injury    Intramural hematoma    Family History  Problem Relation Age of Onset   Cancer Mother    Breast cancer Mother    Hyperlipidemia Father    Hypertension Father    Atrial  fibrillation Father    Deep vein thrombosis Sister    Cancer Other        Family History of Breast cancer   Healthy Daughter    Healthy Daughter    Healthy Daughter    Past Surgical History:  Procedure Laterality Date   ABDOMINAL HYSTERECTOMY  2000   CHOLECYSTECTOMY     Social History   Social History Narrative   Not on file   Immunization History  Administered Date(s) Administered   DT (Pediatric) 11/08/1999   Hepatitis A, Ped/Adol-2 Dose 04/27/2005   Influenza Split 08/21/2009, 09/07/2011, 08/24/2012, 09/02/2014   Influenza,inj,Quad PF,6+ Mos 08/29/2013, 08/12/2016, 08/02/2017, 08/17/2018, 08/09/2019, 09/13/2020, 08/17/2021   Influenza,inj,quad, With Preservative 08/26/2015   MMR 05/03/2005   PFIZER(Purple Top)SARS-COV-2 Vaccination 12/27/2019, 01/17/2020, 07/18/2020, 04/09/2021   Pfizer Covid-19 Vaccine Bivalent Booster 93yr & up 09/09/2021   Td 08/06/2012   Tdap 11/16/2007, 11/08/2019     Objective: Vital Signs: BP 112/71 (BP Location: Right Arm, Patient Position: Sitting, Cuff Size: Normal)   Pulse 82   Resp 15   Ht 5' 4"  (1.626 m)   Wt 123 lb 9.6 oz (56.1 kg)   BMI 21.22 kg/m    Physical Exam HENT:     Mouth/Throat:     Mouth: Mucous membranes are moist.     Pharynx: Oropharynx is clear.  Eyes:     Conjunctiva/sclera: Conjunctivae normal.  Cardiovascular:     Rate and Rhythm: Normal rate and regular rhythm.  Pulmonary:     Effort: Pulmonary effort is normal.     Breath sounds: Normal breath sounds.  Musculoskeletal:     Right lower leg: No edema.     Left lower leg: No edema.  Lymphadenopathy:     Cervical: No cervical adenopathy.  Skin:    General: Skin is warm and dry.     Findings: No rash.  Neurological:     Mental Status: She is alert.  Psychiatric:        Mood and Affect: Mood normal.      Musculoskeletal Exam:  Shoulders full ROM no tenderness or swelling Elbows full ROM no tenderness or swelling Wrists full ROM no tenderness or  swelling Fingers full ROM no tenderness or swelling Knees with crepitus bilaterally range of motion is grossly intact no palpable effusions Ankles full ROM no tenderness or swelling   Investigation: No additional findings.  Imaging: XR Hand 2 View Left  Result Date: 06/30/2022 Xray left hand 2 views Radiocarpal joint space appears normal. [facility] joint spaces appear normal. Moderately severe DIP joints osteoarthritis. No erosions or abnormal calcifications seen. Impression Osteoarthritis in predominantly distal joints  XR Hand 2 View Right  Result Date: 06/30/2022 Xray right hand 2 views Radiocarpal joint space appears normal. [facility] MCP joint spaces appear normal. Mild 3rd PIP and moderately severe DIP joints osteoarthritis. No erosions or abnormal calcifications seen. Impression Osteoarthritis in predominantly distal joints   Recent Labs: Lab Results  Component Value Date   WBC 11.3 (H)  03/08/2022   HGB 14.5 03/08/2022   PLT 321 03/08/2022   NA 134 (L) 03/08/2022   K 3.4 (L) 03/08/2022   CL 98 03/08/2022   CO2 27 03/08/2022   GLUCOSE 90 03/08/2022   BUN 10 03/08/2022   CREATININE 0.61 03/08/2022   BILITOT 0.5 03/08/2022   ALKPHOS 76 03/08/2022   AST 14 (L) 03/08/2022   ALT 9 03/08/2022   PROT 8.4 (H) 03/08/2022   ALBUMIN 4.4 03/08/2022   CALCIUM 9.5 03/08/2022   GFRAA >60 06/15/2017    Speciality Comments: No specialty comments available.  Procedures:  No procedures performed Allergies: Celecoxib and Hydrochlorothiazide   Assessment / Plan:     Visit Diagnoses: Elevated sedimentation rate - Plan: Sedimentation rate, Rheumatoid factor, ANA, IgG, IgA, IgM  Unclear cause of persistent elevated inflammatory markers no definite objective inflammation changes on exam today.  We will recheck the sedimentation rate test.  Also rechecking rheumatoid factor and ANA which have been negative previously.   We will check a quantitative immunoglobulins with her elevated total proteins and possible inflammatory arthritis.  Osteoarthritis of multiple joints, unspecified osteoarthritis type - Plan: XR Hand 2 View Right, XR Hand 2 View Left  Definite knee osteoarthritis bilaterally and has already been evaluated with outside providers.  Hand pain is newer and x-rays checked in clinic today just show mild osteoarthritis.  No erosive disease and no active synovitis on examination today but checking inflammation work-up as above.  Aortitis ([place])  Orders: Orders Placed This Encounter  Procedures   XR Hand 2 View Right   XR Hand 2 View Left   Sedimentation rate   Rheumatoid factor   ANA   IgG, IgA, IgM   No orders of the defined types were placed in this encounter.    Follow-Up Instructions: Return in about 2 weeks (around 07/14/2022) for New pt +ESR?/OA f/u 2-3 wks.   Collier Salina, MD  Note - This record has been created using [employer].  Chart creation errors have been sought, but may not always  have been located. Such creation errors do not reflect on  the standard of medical care.

## 2022-06-30 ENCOUNTER — Ambulatory Visit (INDEPENDENT_AMBULATORY_CARE_PROVIDER_SITE_OTHER): Payer: 59

## 2022-06-30 ENCOUNTER — Encounter: Payer: Self-pay | Admitting: Internal Medicine

## 2022-06-30 ENCOUNTER — Ambulatory Visit: Payer: 59 | Attending: Internal Medicine | Admitting: Internal Medicine

## 2022-06-30 VITALS — BP 112/71 | HR 82 | Resp 15 | Ht 64.0 in | Wt 123.6 lb

## 2022-06-30 DIAGNOSIS — R700 Elevated erythrocyte sedimentation rate: Secondary | ICD-10-CM

## 2022-06-30 DIAGNOSIS — M159 Polyosteoarthritis, unspecified: Secondary | ICD-10-CM

## 2022-06-30 DIAGNOSIS — M79642 Pain in left hand: Secondary | ICD-10-CM | POA: Diagnosis not present

## 2022-06-30 DIAGNOSIS — M79641 Pain in right hand: Secondary | ICD-10-CM | POA: Diagnosis not present

## 2022-06-30 DIAGNOSIS — I776 Arteritis, unspecified: Secondary | ICD-10-CM | POA: Diagnosis not present

## 2022-06-30 NOTE — Progress Notes (Signed)
Office Visit Note  Patient: Alexandra Jordan             Date of Birth: 1956-02-03           MRN: [id number]             PCP: Lawerance Cruel, MD Referring: Lawerance Cruel, MD Visit Date: 07/13/2022   Subjective:  Follow-up (Feeling about the same as last visit. )   History of Present Illness: Alexandra Jordan is a 66 y.o. female here for follow up for evaluation of intermittent neutrophilia and sedimentation rate elevation with symptoms of joint pain at multiple areas.  Lab results at initial visit were negative for rheumatoid factor she still had a low positive ANA titer and mildly elevated sedimentation rate.  Immunoglobulin test showed elevated isolated IgA level.  Previous HPI 06/30/2022 Alexandra Jordan is a 66 y.o. female here for evaluation of intermittent neutrophilia and sedimentation rate elevation with symptoms of joint pain at multiple areas. She has a history of knee pain related to osteoarthritis that is longstanding. Previous trial of intraarticular steroid injection without symptom improvement.  Symptoms are typically worse with prolonged time on her feet particularly stair climbing or impactful motions.  More recently she has been experiencing some pain and stiffness affecting the hands.  Does have stiffness in the morning but lasting only a few minutes.  She experiences swelling most commonly affecting her ankles without seeing overlying skin changes.  She is not currently on any long-term medication for joint pains. She is not experiencing any unintentional weight loss night sweats lymphadenopathy or new skin rashes. She has a history of isolated aortitis from about 10 years ago.  She had a pretty thorough evaluation including rheumatology work-up at that time that did not identify underlying systemic process.  She did not require prolonged follow-up with rheumatology after that.   Labs reviewed 02/2022 ESR 65 CRP 0.8 CBC WBC 11.3 neutrophils 8.3 CMP NA  134 K 3.4 TP 8.4   Activities of Daily Living:  Patient reports morning stiffness for 5 minutes.   Patient Reports nocturnal pain.  Difficulty dressing/grooming: Reports Difficulty climbing stairs: Reports Difficulty getting out of chair: Denies Difficulty using hands for taps, buttons, cutlery, and/or writing: Denies    Review of Systems  Constitutional:  Negative for fatigue.  HENT:  Negative for mouth sores and mouth dryness.   Eyes:  Negative for dryness.  Respiratory:  Negative for shortness of breath.   Cardiovascular:  Negative for chest pain and palpitations.  Gastrointestinal:  Negative for blood in stool, constipation and diarrhea.  Endocrine: Negative for increased urination.  Genitourinary:  Negative for involuntary urination.  Musculoskeletal:  Positive for joint pain, gait problem, joint pain, joint swelling and morning stiffness. Negative for myalgias, muscle weakness, muscle tenderness and myalgias.  Skin:  Positive for hair loss. Negative for color change, rash and sensitivity to sunlight.  Allergic/Immunologic: Negative for susceptible to infections.  Neurological:  Negative for dizziness and headaches.  Hematological:  Negative for swollen glands.  Psychiatric/Behavioral:  Positive for sleep disturbance. Negative for depressed mood. The patient is nervous/anxious.     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   Bilateral knee effusions 07/13/2022   Osteoarthritis, multiple sites 06/30/2022   Elevated sedimentation rate 06/30/2022   Neutrophilia 03/10/2022   Hyponatremia 06/14/2017   Pain in the abdomen 06/14/2017   SIRS (systemic inflammatory response syndrome) (  [place])    Aortitis ([place]) 08/14/2011   Hypokalemia 08/14/2011   Anemia 08/14/2011   Hematuria 08/14/2011    Past Medical History:  Diagnosis Date   Abnormal laboratory test    Abnormal white blood cell count    Acute  thoracic back pain    Anemia    Anxiety    Constipation    ESR raised    Family history of breast cancer    GERD (gastroesophageal reflux disease)    Heart murmur    Hepatic steatosis    High cholesterol    HTN (hypertension)    Leukocytosis    OA (osteoarthritis)    Thoracic aorta injury    Intramural hematoma    Family History  Problem Relation Age of Onset   Cancer Mother    Breast cancer Mother    Hyperlipidemia Father    Hypertension Father    Atrial fibrillation Father    Deep vein thrombosis Sister    Cancer Other        Family History of Breast cancer   Healthy Daughter    Healthy Daughter    Healthy Daughter    Past Surgical History:  Procedure Laterality Date   ABDOMINAL HYSTERECTOMY  2000   CHOLECYSTECTOMY     Social History   Social History Narrative   Not on file   Immunization History  Administered Date(s) Administered   DT (Pediatric) 11/08/1999   Hepatitis A, Ped/Adol-2 Dose 04/27/2005   Influenza Split 08/21/2009, 09/07/2011, 08/24/2012, 09/02/2014   Influenza,inj,Quad PF,6+ Mos 08/29/2013, 08/12/2016, 08/02/2017, 08/17/2018, 08/09/2019, 09/13/2020, 08/17/2021   Influenza,inj,quad, With Preservative 08/26/2015   MMR 05/03/2005   PFIZER(Purple Top)SARS-COV-2 Vaccination 12/27/2019, 01/17/2020, 07/18/2020, 04/09/2021   Pfizer Covid-19 Vaccine Bivalent Booster 8yr & up 09/09/2021   Td 08/06/2012   Tdap 11/16/2007, 11/08/2019     Objective: Vital Signs: BP 102/68 (BP Location: Left Arm, Patient Position: Sitting, Cuff Size: Normal)   Pulse 94   Resp 14   Ht _0  (1.626 m)   Wt 125 lb 12.8 oz (57.1 kg)   BMI 21.59 kg/m    Physical Exam Cardiovascular:     Rate and Rhythm: Normal rate and regular rhythm.  Pulmonary:     Effort: Pulmonary effort is normal.     Breath sounds: Normal breath sounds.  Skin:    General: Skin is warm and dry.     Findings: No rash.  Neurological:     Mental Status: She is alert.  Psychiatric:         Mood and Affect: Mood normal.      Musculoskeletal Exam:  Shoulders full ROM no tenderness or swelling Elbows full ROM no tenderness or swelling Wrists full ROM no tenderness or swelling Fingers full ROM no tenderness or swelling Knees full ROM no tenderness small palpable effusions bilaterally mild patellofemoral crepitus present no warmth or erythema Ankles full ROM no tenderness or swelling   Investigation: No additional findings.  Imaging: XR Hand 2 View Left  Result Date: 06/30/2022 Xray left hand 2 views Radiocarpal joint space appears normal. Mild degenerative change at [facility]st [facility]. MCP joint spaces appear normal. Moderately severe DIP joints osteoarthritis. No erosions or abnormal calcifications seen. Impression Osteoarthritis in predominantly distal joints  XR Hand 2 View Right  Result Date: 06/30/2022 Xray right hand 2 views Radiocarpal joint space appears normal. [facility] MCP joint spaces appear normal. Mild 3rd PIP and moderately severe DIP joints osteoarthritis. No erosions or  abnormal calcifications seen. Impression Osteoarthritis in predominantly distal joints   Recent Labs: Lab Results  Component Value Date   WBC 11.3 (H) 03/08/2022   HGB 14.5 03/08/2022   PLT 321 03/08/2022   NA 134 (L) 03/08/2022   K 3.4 (L) 03/08/2022   CL 98 03/08/2022   CO2 27 03/08/2022   GLUCOSE 90 03/08/2022   BUN 10 03/08/2022   CREATININE 0.61 03/08/2022   BILITOT 0.5 03/08/2022   ALKPHOS 76 03/08/2022   AST 14 (L) 03/08/2022   ALT 9 03/08/2022   PROT 8.4 (H) 03/08/2022   ALBUMIN 4.4 03/08/2022   CALCIUM 9.5 03/08/2022   GFRAA >60 06/15/2017    Speciality Comments: No specialty comments available.  Procedures:  Large Joint Inj: L knee on 07/13/2022 1:40 PM Indications: pain, joint swelling and diagnostic evaluation Details: 22 G 1.5 in needle, ultrasound-guided medial approach Medications: 3 mL lidocaine 1 %; 40 mg triamcinolone  acetonide 40 MG/ML Aspirate: 12 mL blood-tinged, yellow and clear; sent for lab analysis Outcome: tolerated well, no immediate complications Consent was given by the patient. Immediately prior to procedure a time out was called to verify the correct patient, procedure, equipment, support staff and site/side marked as required. Patient was prepped and draped in the usual sterile fashion.     Allergies: Celecoxib and Hydrochlorothiazide   Assessment / Plan:     Visit Diagnoses: Elevated sedimentation rate - Plan: Synovial Fluid Analysis, Complete  Laboratory work-up remains pretty nonspecific still has markers of inflammation but no disease specific antibodies.  Fluid analysis of the knee joint today.  If this fluid demonstrates crystalline arthropathy that would be informative but she has no history.  If inflammatory fluid would recommend trial of medication treatment for seronegative RA.  Osteoarthritis of multiple joints, unspecified osteoarthritis type - Plan: Synovial Fluid Analysis, Complete  Possible alternate explanation with osteoarthritis of bilateral knees could be osteoarthritis with effusion.  Fluid analysis hoping to differentiate degree of inflammation would usually expect noninflammatory fluid analysis in pure OA picture.  Aortitis (H[facility])   No evidence of ongoing vasculitis on subsequent imaging or no suggestive symptomology.  Bilateral knee effusions - Plan: Large Joint Inj: L knee, Synovial Fluid Analysis, Complete  Knee effusions previously pain did not improve much with a local steroid injection hopefully aspiration of significant fluid volume will improve symptoms more efficiently and also repeat of steroid injection.   Orders: Orders Placed This Encounter  Procedures   Large Joint Inj: L knee   Synovial Fluid Analysis, Complete   No orders of the defined types were placed in this encounter.    Follow-Up Instructions: Return in about 3 months (around 10/13/2022)  for ?RA/OA inj f/u 42mo.   CCollier Salina MD  Note - This record has been created using [employer]  Chart creation errors have been sought, but may not always  have been located. Such creation errors do not reflect on  the standard of medical care.

## 2022-07-04 LAB — ANTI-NUCLEAR AB-TITER (ANA TITER): ANA Titer 1: 1:80 {titer} — ABNORMAL HIGH

## 2022-07-04 LAB — RHEUMATOID FACTOR: Rheumatoid fact SerPl-aCnc: <14 IU/mL (ref <14)

## 2022-07-04 LAB — IGG, IGA, IGM
IgG (Immunoglobin G), Serum: 1093 mg/dL (ref 600–1540)
IgM, Serum: 78 mg/dL (ref 50–300)
Immunoglobulin A: 760 mg/dL — ABNORMAL HIGH (ref 70–320)

## 2022-07-04 LAB — ANA: Anti Nuclear Antibody (ANA): POSITIVE — AB

## 2022-07-04 LAB — SEDIMENTATION RATE: Sed Rate: 33 mm/h — ABNORMAL HIGH (ref 0–30)

## 2022-07-13 ENCOUNTER — Ambulatory Visit: Payer: 59 | Attending: Internal Medicine | Admitting: Internal Medicine

## 2022-07-13 ENCOUNTER — Telehealth: Payer: Self-pay | Admitting: Internal Medicine

## 2022-07-13 ENCOUNTER — Encounter: Payer: Self-pay | Admitting: Internal Medicine

## 2022-07-13 VITALS — BP 102/68 | HR 94 | Resp 14 | Ht 64.0 in | Wt 125.8 lb

## 2022-07-13 DIAGNOSIS — M25462 Effusion, left knee: Secondary | ICD-10-CM

## 2022-07-13 DIAGNOSIS — M25461 Effusion, right knee: Secondary | ICD-10-CM | POA: Insufficient documentation

## 2022-07-13 DIAGNOSIS — I776 Arteritis, unspecified: Secondary | ICD-10-CM | POA: Diagnosis not present

## 2022-07-13 DIAGNOSIS — R700 Elevated erythrocyte sedimentation rate: Secondary | ICD-10-CM

## 2022-07-13 DIAGNOSIS — M159 Polyosteoarthritis, unspecified: Secondary | ICD-10-CM

## 2022-07-13 LAB — SYNOVIAL FLUID ANALYSIS, COMPLETE
Basophils, %: 0 %
Eosinophils-Synovial: 0 % (ref 0–2)
Lymphocytes-Synovial Fld: 1 % (ref 0–74)
Monocyte/Macrophage: 8 % (ref 0–69)
Neutrophil, Synovial: 91 % — ABNORMAL HIGH (ref 0–24)
Synoviocytes, %: 0 % (ref 0–15)
WBC, Synovial: 5861 cells/uL — ABNORMAL HIGH (ref <150)

## 2022-07-13 NOTE — Telephone Encounter (Signed)
Patient called stating Dr. Benjamine Mola was going to review her white blood count at her appointment, but she forgot to ask before she checked out.  Patient requested a return call to let her know if it is normal.

## 2022-07-14 NOTE — Progress Notes (Signed)
Left VM x1 attempting to call regarding results. Knee fluid shows elevated white blood cell count so this does appear to be an inflammatory process and not just osteoarthritis. Might be what would be considered a seronegative rheumatoid arthritis, without the specific disease antibodies. I recommend we try starting a medication for this. We discussed hydroxychloroquine briefly in clinic if she is interested we could send some information to review but I think this would be the best initial option.

## 2022-07-19 ENCOUNTER — Ambulatory Visit: Payer: 59 | Attending: Internal Medicine | Admitting: Internal Medicine

## 2022-07-19 ENCOUNTER — Encounter: Payer: Self-pay | Admitting: Internal Medicine

## 2022-07-19 VITALS — BP 107/72 | HR 84 | Resp 15 | Ht 64.0 in | Wt 125.4 lb

## 2022-07-19 DIAGNOSIS — M159 Polyosteoarthritis, unspecified: Secondary | ICD-10-CM

## 2022-07-19 DIAGNOSIS — R700 Elevated erythrocyte sedimentation rate: Secondary | ICD-10-CM

## 2022-07-19 DIAGNOSIS — M25461 Effusion, right knee: Secondary | ICD-10-CM

## 2022-07-19 DIAGNOSIS — I776 Arteritis, unspecified: Secondary | ICD-10-CM | POA: Diagnosis not present

## 2022-07-19 DIAGNOSIS — M0600 Rheumatoid arthritis without rheumatoid factor, unspecified site: Secondary | ICD-10-CM | POA: Diagnosis not present

## 2022-07-19 DIAGNOSIS — M25462 Effusion, left knee: Secondary | ICD-10-CM

## 2022-07-19 MED ORDER — HYDROXYCHLOROQUINE SULFATE 200 MG PO TABS: 200.0000 mg | ORAL_TABLET | Freq: Every day | ORAL | 2 refills | Status: AC

## 2022-07-19 NOTE — Progress Notes (Signed)
Office Visit Note  Patient: Alexandra Jordan             Date of Birth: June 12, 1956           MRN: [id number]             PCP: Lawerance Cruel, MD Referring: Lawerance Cruel, MD Visit Date: 07/19/2022   Subjective:  Follow-up (Patient has questions about medications and auto-immune disorder. )   History of Present Illness: Alexandra Jordan is a 66 y.o. 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  Synovial fluid analysis was consistent with an inflammatory effusion and negative for crystals or infection.  The swelling is significantly decreased in the knee although she still experiences pain and crepitus so far in the subsequent week.   Previous HPI 07/13/22 Alexandra Jordan is a 66 y.o. female here for follow up for evaluation of intermittent neutrophilia and sedimentation rate elevation with symptoms of joint pain at multiple areas    Previous HPI 06/30/2022 Alexandra Jordan is a 66 y.o. female here for evaluation of intermittent neutrophilia and sedimentation rate elevation with symptoms of joint pain at multiple areas. She has a history of knee pain related to osteoarthritis that is longstanding. Previous trial of intraarticular steroid injection without symptom improvement.  Symptoms are typically worse with prolonged time on her feet particularly stair climbing or impactful motions.  More recently she has been experiencing some pain and stiffness affecting the hands.  Does have stiffness in the morning but lasting only a few minutes.  She experiences swelling most commonly affecting her ankles without seeing overlying skin changes.  She is not currently on any long-term medication for joint pains. She is not experiencing any unintentional weight loss night sweats lymphadenopathy or new skin rashes. She has a history of isolated aortitis from about 10 years ago.  She had a pretty thorough evaluation including  rheumatology work-up at that time that did not identify underlying systemic process.  She did not require prolonged follow-up with rheumatology after that.   Labs reviewed 02/2022 ESR 65 CRP 0.8 CBC WBC 11.3 neutrophils 8.3 CMP NA 134 K 3.4 TP 8.4     Review of Systems  Constitutional:  Negative for fatigue.  HENT:  Negative for mouth sores and mouth dryness.   Eyes:  Negative for dryness.  Respiratory:  Negative for shortness of breath.   Cardiovascular:  Negative for chest pain and palpitations.  Gastrointestinal:  Negative for blood in stool, constipation and diarrhea.  Endocrine: Negative for increased urination.  Genitourinary:  Negative for involuntary urination.  Musculoskeletal:  Positive for joint pain, gait problem, joint pain, joint swelling, myalgias, morning stiffness and myalgias. Negative for muscle weakness and muscle tenderness.  Skin:  Positive for hair loss. Negative for color change, rash and sensitivity to sunlight.  Allergic/Immunologic: Negative for susceptible to infections.  Neurological:  Negative for dizziness and headaches.  Hematological:  Negative for swollen glands.  Psychiatric/Behavioral:  Positive for depressed mood and sleep disturbance. The patient is nervous/anxious.     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   Bilateral knee effusions 07/13/2022   Osteoarthritis, multiple sites 06/30/2022   Elevated sedimentation rate 06/30/2022   Neutrophilia 03/10/2022   Hyponatremia 06/14/2017   Pain in the abdomen 06/14/2017   SIRS (systemic inflammatory response syndrome) ([place])    Aortitis ([place]) 08/14/2011  Hypokalemia 08/14/2011   Anemia 08/14/2011   Hematuria 08/14/2011    Past Medical History:  Diagnosis Date   Abnormal laboratory test    Abnormal white blood cell count    Acute thoracic back pain    Anemia    Anxiety    Constipation    ESR raised    Family  history of breast cancer    GERD (gastroesophageal reflux disease)    Heart murmur    Hepatic steatosis    High cholesterol    HTN (hypertension)    Leukocytosis    OA (osteoarthritis)    Thoracic aorta injury    Intramural hematoma    Family History  Problem Relation Age of Onset   Cancer Mother    Breast cancer Mother    Hyperlipidemia Father    Hypertension Father    Atrial fibrillation Father    Deep vein thrombosis Sister    Cancer Other        Family History of Breast cancer   Healthy Daughter    Healthy Daughter    Healthy Daughter    Past Surgical History:  Procedure Laterality Date   ABDOMINAL HYSTERECTOMY  2000   CHOLECYSTECTOMY     Social History   Social History Narrative   Not on file   Immunization History  Administered Date(s) Administered   DT (Pediatric) 11/08/1999   Hepatitis A, Ped/Adol-2 Dose 04/27/2005   Influenza Split 08/21/2009, 09/07/2011, 08/24/2012, 09/02/2014   Influenza,inj,Quad PF,6+ Mos 08/29/2013, 08/12/2016, 08/02/2017, 08/17/2018, 08/09/2019, 09/13/2020, 08/17/2021   Influenza,inj,quad, With Preservative 08/26/2015   MMR 05/03/2005   PFIZER(Purple Top)SARS-COV-2 Vaccination 12/27/2019, 01/17/2020, 07/18/2020, 04/09/2021   Pfizer Covid-19 Vaccine Bivalent Booster 30yr & up 09/09/2021   Td 08/06/2012   Tdap 11/16/2007, 11/08/2019     Objective: Vital Signs: BP 107/72 (BP Location: Left Arm, Patient Position: Sitting, Cuff Size: Normal)   Pulse 84   Resp 15   Ht _0  (1.626 m)   Wt 125 lb 6.4 oz (56.9 kg)   BMI 21.52 kg/m    Physical Exam Cardiovascular:     Rate and Rhythm: Normal rate and regular rhythm.  Pulmonary:     Effort: Pulmonary effort is normal.     Breath sounds: Normal breath sounds.  Skin:    General: Skin is warm and dry.     Findings: No rash.  Neurological:     Mental Status: She is alert.  Psychiatric:        Mood and Affect: Mood normal.      Musculoskeletal Exam:  Shoulders full ROM no  tenderness or swelling Elbows full ROM no tenderness or swelling Wrists full ROM no tenderness or swelling Fingers full ROM no tenderness or swelling Knees full ROM, crepitus present, trace left knee effusion   CDAI Exam: CDAI Score: 7  Patient Global: 20 mm; Provider Global: 20 mm Swollen: 1 ; Tender: 2  Joint Exam 07/19/2022      Right  Left  Knee   Tender  Swollen Tender     Investigation: No additional findings.  Imaging: No results found.  Recent Labs: Lab Results  Component Value Date   WBC 11.3 (H) 03/08/2022   HGB 14.5 03/08/2022   PLT 321 03/08/2022   NA 134 (L) 03/08/2022   K 3.4 (L) 03/08/2022   CL 98 03/08/2022   CO2 27 03/08/2022   GLUCOSE 90 03/08/2022   BUN 10 03/08/2022   CREATININE 0.61 03/08/2022   BILITOT 0.5 03/08/2022   ALKPHOS  76 03/08/2022   AST 14 (L) 03/08/2022   ALT 9 03/08/2022   PROT 8.4 (H) 03/08/2022   ALBUMIN 4.4 03/08/2022   CALCIUM 9.5 03/08/2022   GFRAA >60 06/15/2017    Speciality Comments: No specialty comments available.  Procedures:  No procedures performed Allergies: Celecoxib and Hydrochlorothiazide   Assessment / Plan:     Visit Diagnoses: Seronegative rheumatoid arthritis ([place])  Bilateral knee effusions - Plan: hydroxychloroquine (PLAQUENIL) 200 MG tablet  Chronic and recurring joint swelling most limited to the knees but with elevated serum inflammatory markers and inflammatory synovial fluid analysis looks suspicious for a seronegative RA process.  Osteoarthritis with effusion is a possibility but would not explain laboratory findings thus far.  No evidence of erosive or deforming disease.  We will plan to start hydroxychloroquine 200 mg daily and continuing her baseline NSAID treatment.  She had some concerns about diagnosis and medication plan or side effect risks which we discussed in detail today including GI intolerance, hypersensitivity reaction, QT prolongation or cardiac effects, and need for retinal toxicity  monitoring if continue long-term use.  Osteoarthritis of multiple joints, unspecified osteoarthritis type  Agree with continue meloxicam 15 mg daily or as needed for osteoarthritis joint pain of multiple areas.  Aortitis ([place])  History of somewhat idiopathic aortitis now with elevated serum inflammatory markers but no signs or symptoms suggestive for active vascular disease or arteritis at this time.   Orders: No orders of the defined types were placed in this encounter.  Meds ordered this encounter  Medications   hydroxychloroquine (PLAQUENIL) 200 MG tablet    Sig: Take 1 tablet (200 mg total) by mouth daily.    Dispense:  30 tablet    Refill:  2     Follow-Up Instructions: Return in about 3 months (around 10/19/2022) for ?RA/OA HCQ start f/u 9mo.   CCollier Salina MD  Note - This record has been created using [employer]  Chart creation errors have been sought, but may not always  have been located. Such creation errors do not reflect on  the standard of medical care.

## 2022-07-19 NOTE — Patient Instructions (Signed)

## 2022-07-25 MED ORDER — LIDOCAINE HCL 1 % IJ SOLN
3.0000 mL | INTRAMUSCULAR | Status: AC | PRN
  Administered 2022-07-13: 3 mL

## 2022-07-25 MED ORDER — TRIAMCINOLONE ACETONIDE 40 MG/ML IJ SUSP
40.0000 mg | INTRAMUSCULAR | Status: AC | PRN
  Administered 2022-07-13: 40 mg via INTRA_ARTICULAR

## 2022-09-06 ENCOUNTER — Telehealth: Payer: Self-pay | Admitting: Interventional Cardiology

## 2022-09-06 NOTE — Telephone Encounter (Signed)
Patient called stating she has not started taking and decided to postpone taking the rosuvastatin (CRESTOR) 10 MG tablet.  Patient stated she plans to start taking the medication in January and will call back to reschedule her lab work.

## 2022-09-07 NOTE — Telephone Encounter (Signed)
I spoke with patient. She is going to wait until after the holidays to start Rosuvastatin.  She is aware she will need fasting lab work checked 3 months after starting medication.  She will call to schedule lab work based on when she starts Rosuvastatin.

## 2022-09-09 ENCOUNTER — Ambulatory Visit: Payer: Self-pay | Admitting: Physician Assistant

## 2022-09-09 ENCOUNTER — Other Ambulatory Visit: Payer: 59

## 2022-10-12 ENCOUNTER — Telehealth: Payer: Self-pay | Admitting: Internal Medicine

## 2022-10-12 NOTE — Telephone Encounter (Signed)
Patient left a voicemail requesting to speak with Dr. Benjamine Mola or someone in clinic. Patient states she would like to talk to someone about her upcoming jury duty. Patient states she doesn't know how she's going to be able to do it.

## 2022-10-12 NOTE — Telephone Encounter (Signed)
Letter printed and signed. She is not on MyChart, please let her know to come by and pick up whenever.

## 2022-10-12 NOTE — Telephone Encounter (Signed)
LMOM advising patient that jury duty letter is in the front desk for her to pick up.

## 2022-10-19 ENCOUNTER — Ambulatory Visit: Payer: 59 | Admitting: Internal Medicine

## 2022-11-20 NOTE — Progress Notes (Deleted)
Office Visit Note  Patient: Alexandra Jordan             Date of Birth: 1955/11/21           MRN: [id number]             PCP: Lawerance Cruel, MD Referring: Lawerance Cruel, MD Visit Date: 11/21/2022   Subjective:  No chief complaint on file.   History of Present Illness: Alexandra Jordan is a 67 y.o. female here for follow up ***   Previous HPI 07/19/22 Alexandra Jordan is a 67 y.o. 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  Synovial fluid analysis was consistent with an inflammatory effusion and negative for crystals or infection.  The swelling is significantly decreased in the knee although she still experiences pain and crepitus so far in the subsequent week.     Previous HPI 07/13/22 Alexandra Jordan is a 67 y.o. female here for follow up for evaluation of intermittent neutrophilia and sedimentation rate elevation with symptoms of joint pain at multiple areas    Previous HPI 06/30/2022 Alexandra Jordan is a 67 y.o. female here for evaluation of intermittent neutrophilia and sedimentation rate elevation with symptoms of joint pain at multiple areas. She has a history of knee pain related to osteoarthritis that is longstanding. Previous trial of intraarticular steroid injection without symptom improvement.  Symptoms are typically worse with prolonged time on her feet particularly stair climbing or impactful motions.  More recently she has been experiencing some pain and stiffness affecting the hands.  Does have stiffness in the morning but lasting only a few minutes.  She experiences swelling most commonly affecting her ankles without seeing overlying skin changes.  She is not currently on any long-term medication for joint pains. She is not experiencing any unintentional weight loss night sweats lymphadenopathy or new skin rashes. She has a history of isolated aortitis from about 10 years ago.  She had  a pretty thorough evaluation including rheumatology work-up at that time that did not identify underlying systemic process.  She did not require prolonged follow-up with rheumatology after that.   Labs reviewed 02/2022 ESR 65 CRP 0.8 CBC WBC 11.3 neutrophils 8.3 CMP NA 134 K 3.4 TP 8.4   No Rheumatology ROS completed.   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   Bilateral knee effusions 07/13/2022   Osteoarthritis, multiple sites 06/30/2022   Elevated sedimentation rate 06/30/2022   Neutrophilia 03/10/2022   Hyponatremia 06/14/2017   Pain in the abdomen 06/14/2017   SIRS (systemic inflammatory response syndrome) (HCC)    Aortitis ([place]) 08/14/2011   Hypokalemia 08/14/2011   Anemia 08/14/2011   Hematuria 08/14/2011    Past Medical History:  Diagnosis Date   Abnormal laboratory test    Abnormal white blood cell count    Acute thoracic back pain    Anemia    Anxiety    Constipation    ESR raised    Family history of breast cancer    GERD (gastroesophageal reflux disease)    Heart murmur    Hepatic steatosis    High cholesterol    HTN (hypertension)    Leukocytosis    OA (osteoarthritis)    Thoracic aorta injury    Intramural hematoma    Family History  Problem Relation Age of Onset   Cancer Mother  Breast cancer Mother    Hyperlipidemia Father    Hypertension Father    Atrial fibrillation Father    Deep vein thrombosis Sister    Cancer Other        Family History of Breast cancer   Healthy Daughter    Healthy Daughter    Healthy Daughter    Past Surgical History:  Procedure Laterality Date   ABDOMINAL HYSTERECTOMY  2000   CHOLECYSTECTOMY     Social History   Social History Narrative   Not on file   Immunization History  Administered Date(s) Administered   DT (Pediatric) 11/08/1999   Hepatitis A, Ped/Adol-2 Dose 04/27/2005   Influenza Split 08/21/2009, 09/07/2011,  08/24/2012, 09/02/2014   Influenza,inj,Quad PF,6+ Mos 08/29/2013, 08/12/2016, 08/02/2017, 08/17/2018, 08/09/2019, 09/13/2020, 08/17/2021   Influenza,inj,quad, With Preservative 08/26/2015   MMR 05/03/2005   PFIZER(Purple Top)SARS-COV-2 Vaccination 12/27/2019, 01/17/2020, 07/18/2020, 04/09/2021   Pfizer Covid-19 Vaccine Bivalent Booster 64yr & up 09/09/2021   Td 08/06/2012   Tdap 11/16/2007, 11/08/2019     Objective: Vital Signs: There were no vitals taken for this visit.   Physical Exam   Musculoskeletal Exam: ***  CDAI Exam: CDAI Score: -- Patient Global: --; Provider Global: -- Swollen: --; Tender: -- Joint Exam 11/21/2022   No joint exam has been documented for this visit   There is currently no information documented on the homunculus. Go to the Rheumatology activity and complete the homunculus joint exam.  Investigation: No additional findings.  Imaging: No results found.  Recent Labs: Lab Results  Component Value Date   WBC 11.3 (H) 03/08/2022   HGB 14.5 03/08/2022   PLT 321 03/08/2022   NA 134 (L) 03/08/2022   K 3.4 (L) 03/08/2022   CL 98 03/08/2022   CO2 27 03/08/2022   GLUCOSE 90 03/08/2022   BUN 10 03/08/2022   CREATININE 0.61 03/08/2022   BILITOT 0.5 03/08/2022   ALKPHOS 76 03/08/2022   AST 14 (L) 03/08/2022   ALT 9 03/08/2022   PROT 8.4 (H) 03/08/2022   ALBUMIN 4.4 03/08/2022   CALCIUM 9.5 03/08/2022   GFRAA >60 06/15/2017    Speciality Comments: No specialty comments available.  Procedures:  No procedures performed Allergies: Celecoxib and Hydrochlorothiazide   Assessment / Plan:     Visit Diagnoses: No diagnosis found.  ***  Orders: No orders of the defined types were placed in this encounter.  No orders of the defined types were placed in this encounter.    Follow-Up Instructions: No follow-ups on file.   CCollier Salina MD  Note - This record has been created using [employer]  Chart creation errors have been  sought, but may not always  have been located. Such creation errors do not reflect on  the standard of medical care.

## 2022-11-21 ENCOUNTER — Ambulatory Visit: Payer: 59 | Admitting: Internal Medicine

## 2022-12-11 NOTE — Progress Notes (Deleted)
Office Visit Note  Patient: Alexandra Jordan             Date of Birth: May 13, 1956           MRN: [id number]             PCP: Lawerance Cruel, MD Referring: Lawerance Cruel, MD Visit Date: 12/12/2022   Subjective:  No chief complaint on file.   History of Present Illness: ANACAREN LEWI is a 67 y.o. female here for follow up ***   Previous HPI 07/19/22 ELEORA BRANNING is a 67 y.o. 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  Synovial fluid analysis was consistent with an inflammatory effusion and negative for crystals or infection.  The swelling is significantly decreased in the knee although she still experiences pain and crepitus so far in the subsequent week.     Previous HPI 07/13/22 REBBIE HIRT is a 67 y.o. female here for follow up for evaluation of intermittent neutrophilia and sedimentation rate elevation with symptoms of joint pain at multiple areas    Previous HPI 06/30/2022 LORECE GARGUS is a 67 y.o. female here for evaluation of intermittent neutrophilia and sedimentation rate elevation with symptoms of joint pain at multiple areas. She has a history of knee pain related to osteoarthritis that is longstanding. Previous trial of intraarticular steroid injection without symptom improvement.  Symptoms are typically worse with prolonged time on her feet particularly stair climbing or impactful motions.  More recently she has been experiencing some pain and stiffness affecting the hands.  Does have stiffness in the morning but lasting only a few minutes.  She experiences swelling most commonly affecting her ankles without seeing overlying skin changes.  She is not currently on any long-term medication for joint pains. She is not experiencing any unintentional weight loss night sweats lymphadenopathy or new skin rashes. She has a history of isolated aortitis from about 10 years ago.  She had  a pretty thorough evaluation including rheumatology work-up at that time that did not identify underlying systemic process.  She did not require prolonged follow-up with rheumatology after that.   Labs reviewed 02/2022 ESR 65 CRP 0.8 CBC WBC 11.3 neutrophils 8.3 CMP NA 134 K 3.4 TP 8.4   No Rheumatology ROS completed.   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   Bilateral knee effusions 07/13/2022   Osteoarthritis, multiple sites 06/30/2022   Elevated sedimentation rate 06/30/2022   Neutrophilia 03/10/2022   Hyponatremia 06/14/2017   Pain in the abdomen 06/14/2017   SIRS (systemic inflammatory response syndrome) (HCC)    Aortitis ([place]) 08/14/2011   Hypokalemia 08/14/2011   Anemia 08/14/2011   Hematuria 08/14/2011    Past Medical History:  Diagnosis Date   Abnormal laboratory test    Abnormal white blood cell count    Acute thoracic back pain    Anemia    Anxiety    Constipation    ESR raised    Family history of breast cancer    GERD (gastroesophageal reflux disease)    Heart murmur    Hepatic steatosis    High cholesterol    HTN (hypertension)    Leukocytosis    OA (osteoarthritis)    Thoracic aorta injury    Intramural hematoma    Family History  Problem Relation Age of Onset   Cancer Mother  Breast cancer Mother    Hyperlipidemia Father    Hypertension Father    Atrial fibrillation Father    Deep vein thrombosis Sister    Cancer Other        Family History of Breast cancer   Healthy Daughter    Healthy Daughter    Healthy Daughter    Past Surgical History:  Procedure Laterality Date   ABDOMINAL HYSTERECTOMY  2000   CHOLECYSTECTOMY     Social History   Social History Narrative   Not on file   Immunization History  Administered Date(s) Administered   DT (Pediatric) 11/08/1999   Hepatitis A, Ped/Adol-2 Dose 04/27/2005   Influenza Split 08/21/2009, 09/07/2011,  08/24/2012, 09/02/2014   Influenza,inj,Quad PF,6+ Mos 08/29/2013, 08/12/2016, 08/02/2017, 08/17/2018, 08/09/2019, 09/13/2020, 08/17/2021   Influenza,inj,quad, With Preservative 08/26/2015   MMR 05/03/2005   PFIZER(Purple Top)SARS-COV-2 Vaccination 12/27/2019, 01/17/2020, 07/18/2020, 04/09/2021   Pfizer Covid-19 Vaccine Bivalent Booster 79yr & up 09/09/2021   Td 08/06/2012   Tdap 11/16/2007, 11/08/2019     Objective: Vital Signs: There were no vitals taken for this visit.   Physical Exam   Musculoskeletal Exam: ***  CDAI Exam: CDAI Score: -- Patient Global: --; Provider Global: -- Swollen: --; Tender: -- Joint Exam 12/12/2022   No joint exam has been documented for this visit   There is currently no information documented on the homunculus. Go to the Rheumatology activity and complete the homunculus joint exam.  Investigation: No additional findings.  Imaging: No results found.  Recent Labs: Lab Results  Component Value Date   WBC 11.3 (H) 03/08/2022   HGB 14.5 03/08/2022   PLT 321 03/08/2022   NA 134 (L) 03/08/2022   K 3.4 (L) 03/08/2022   CL 98 03/08/2022   CO2 27 03/08/2022   GLUCOSE 90 03/08/2022   BUN 10 03/08/2022   CREATININE 0.61 03/08/2022   BILITOT 0.5 03/08/2022   ALKPHOS 76 03/08/2022   AST 14 (L) 03/08/2022   ALT 9 03/08/2022   PROT 8.4 (H) 03/08/2022   ALBUMIN 4.4 03/08/2022   CALCIUM 9.5 03/08/2022   GFRAA >60 06/15/2017    Speciality Comments: No specialty comments available.  Procedures:  No procedures performed Allergies: Celecoxib and Hydrochlorothiazide   Assessment / Plan:     Visit Diagnoses: No diagnosis found.  ***  Orders: No orders of the defined types were placed in this encounter.  No orders of the defined types were placed in this encounter.    Follow-Up Instructions: No follow-ups on file.   CCollier Salina MD  Note - This record has been created using [employer]  Chart creation errors have been  sought, but may not always  have been located. Such creation errors do not reflect on  the standard of medical care.

## 2022-12-12 ENCOUNTER — Ambulatory Visit: Payer: 59 | Admitting: Internal Medicine

## 2023-04-23 ENCOUNTER — Encounter (HOSPITAL_BASED_OUTPATIENT_CLINIC_OR_DEPARTMENT_OTHER): Payer: Self-pay | Admitting: Emergency Medicine

## 2023-04-23 ENCOUNTER — Encounter (HOSPITAL_BASED_OUTPATIENT_CLINIC_OR_DEPARTMENT_OTHER): Payer: Self-pay | Admitting: *Deleted

## 2023-04-23 ENCOUNTER — Emergency Department (HOSPITAL_BASED_OUTPATIENT_CLINIC_OR_DEPARTMENT_OTHER)
Admission: EM | Admit: 2023-04-23 | Discharge: 2023-04-23 | Disposition: A | Discharge status: Home / Self Care | Payer: 59 | Attending: Emergency Medicine | Admitting: Emergency Medicine

## 2023-04-23 ENCOUNTER — Emergency Department (HOSPITAL_BASED_OUTPATIENT_CLINIC_OR_DEPARTMENT_OTHER): Payer: 59

## 2023-04-23 ENCOUNTER — Other Ambulatory Visit: Payer: Self-pay

## 2023-04-23 DIAGNOSIS — Z79899 Other long term (current) drug therapy: Secondary | ICD-10-CM | POA: Diagnosis not present

## 2023-04-23 DIAGNOSIS — R21 Rash and other nonspecific skin eruption: Secondary | ICD-10-CM | POA: Diagnosis present

## 2023-04-23 DIAGNOSIS — B029 Zoster without complications: Secondary | ICD-10-CM | POA: Insufficient documentation

## 2023-04-23 DIAGNOSIS — R1084 Generalized abdominal pain: Secondary | ICD-10-CM | POA: Diagnosis not present

## 2023-04-23 DIAGNOSIS — F1010 Alcohol abuse, uncomplicated: Secondary | ICD-10-CM | POA: Diagnosis not present

## 2023-04-23 DIAGNOSIS — I10 Essential (primary) hypertension: Secondary | ICD-10-CM | POA: Diagnosis not present

## 2023-04-23 DIAGNOSIS — E871 Hypo-osmolality and hyponatremia: Secondary | ICD-10-CM | POA: Diagnosis not present

## 2023-04-23 LAB — COMPREHENSIVE METABOLIC PANEL
ALT: 24 U/L (ref 0–44)
AST: 27 U/L (ref 15–41)
Albumin: 4.3 g/dL (ref 3.5–5.0)
Alkaline Phosphatase: 78 U/L (ref 38–126)
Anion gap: 12 (ref 5–15)
BUN: 12 mg/dL (ref 8–23)
CO2: 22 mmol/L (ref 22–32)
Calcium: 8.9 mg/dL (ref 8.9–10.3)
Chloride: 96 mmol/L — ABNORMAL LOW (ref 98–111)
Creatinine, Ser: 0.72 mg/dL (ref 0.44–1.00)
GFR, Estimated: >60 mL/min (ref >60)
Glucose, Bld: 128 mg/dL — ABNORMAL HIGH (ref 70–99)
Potassium: 3.7 mmol/L (ref 3.5–5.1)
Sodium: 130 mmol/L — ABNORMAL LOW (ref 135–145)
Total Bilirubin: 1.2 mg/dL (ref 0.3–1.2)
Total Protein: 8 g/dL (ref 6.5–8.1)

## 2023-04-23 LAB — URINALYSIS, MICROSCOPIC (REFLEX)
Squamous Epithelial / HPF: NONE SEEN /HPF (ref 0–5)
WBC, UA: NONE SEEN WBC/hpf (ref 0–5)

## 2023-04-23 LAB — CBC
HCT: 45.4 % (ref 36.0–46.0)
Hemoglobin: 15.9 g/dL — ABNORMAL HIGH (ref 12.0–15.0)
MCH: 30.8 pg (ref 26.0–34.0)
MCHC: 35 g/dL (ref 30.0–36.0)
MCV: 88 fL (ref 80.0–100.0)
Platelets: 257 10*3/uL (ref 150–400)
RBC: 5.16 MIL/uL — ABNORMAL HIGH (ref 3.87–5.11)
RDW: 11.7 % (ref 11.5–15.5)
WBC: 9.2 10*3/uL (ref 4.0–10.5)
nRBC: 0 % (ref 0.0–0.2)

## 2023-04-23 LAB — URINALYSIS, ROUTINE W REFLEX MICROSCOPIC
Bilirubin Urine: NEGATIVE
Glucose, UA: NEGATIVE mg/dL
Ketones, ur: NEGATIVE mg/dL
Leukocytes,Ua: NEGATIVE
Nitrite: NEGATIVE
Protein, ur: NEGATIVE mg/dL
Specific Gravity, Urine: 1.015 (ref 1.005–1.030)
pH: 7 (ref 5.0–8.0)

## 2023-04-23 LAB — LIPASE, BLOOD: Lipase: 31 U/L (ref 11–51)

## 2023-04-23 MED ORDER — ALUM & MAG HYDROXIDE-SIMETH 200-200-20 MG/5ML PO SUSP
30.0000 mL | Freq: Once | ORAL | Status: AC
  Administered 2023-04-23: 30 mL via ORAL
  Filled 2023-04-23: qty 30

## 2023-04-23 MED ORDER — SODIUM CHLORIDE 0.9 % IV BOLUS
1000.0000 mL | Freq: Once | INTRAVENOUS | Status: AC
  Administered 2023-04-23: 1000 mL via INTRAVENOUS

## 2023-04-23 MED ORDER — IOHEXOL 300 MG/ML  SOLN
75.0000 mL | Freq: Once | INTRAMUSCULAR | Status: AC | PRN
  Administered 2023-04-23: 75 mL via INTRAVENOUS

## 2023-04-23 MED ORDER — ACYCLOVIR 800 MG PO TABS: 400.0000 mg | ORAL_TABLET | Freq: Every day | ORAL | 0 refills | Status: AC

## 2023-04-23 MED ORDER — ACETAMINOPHEN 325 MG PO TABS: 650.0000 mg | ORAL_TABLET | Freq: Four times a day (QID) | ORAL | 0 refills | Status: AC | PRN

## 2023-04-23 MED ORDER — PANTOPRAZOLE SODIUM 20 MG PO TBEC: 20.0000 mg | DELAYED_RELEASE_TABLET | Freq: Every day | ORAL | 0 refills | Status: AC

## 2023-04-23 NOTE — Discharge Instructions (Addendum)
Consider purchasing a box of topical lidocaine patches at your pharmacy, this can be helpful for your discomfort, instructions found on packaging.  Please follow up with your pcp for a recheck in the next few days  Please avoid exposure to elderly, small children, or those who are immunocompromised while rash is present.  It was a pleasure caring for you today in the emergency department.  Please return to the emergency department for any worsening or worrisome symptoms.

## 2023-04-23 NOTE — ED Triage Notes (Addendum)
Patient here with lower back pain and abdominal pain.  Patient states that the abdominal pain started a few days ago, now she is feeling like she is bloated.  Abdomen does look distended.  No nausea or vomiting.  She states that she has this a few times a year.

## 2023-04-23 NOTE — ED Notes (Signed)
ED Provider at bedside. 

## 2023-04-23 NOTE — ED Provider Notes (Signed)
[facility] Provider Note  CSN: [id number] Arrival date & time: 04/23/23 4098  Chief Complaint(s) Back Pain and Abdominal Pain  HPI Alexandra Jordan is a 67 y.o. female with past medical history as below, significant for anxiety, HLD, HTN, RA, recurrent abd pain who presents to the ED with complaint of abd pain, rash.  Patient reports any abdominal pain over the past, chills or abdomen is more bloated than normal.  No nausea, vomiting, diarrhea, BRBPR or melena.  No fevers or chills.  Does report a rash to her right flank over the past 4 days, somewhat itchy, somewhat tingling.  Alexandra Jordan has not been scratching it.  No history of shingles.  No rash elsewhere including face ears.  Intermittent back pain over the past week which is since resolved.  Similar symptoms in the past with abdominal pain and back pain.  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 Authorizing Provider   acetaminophen (TYLENOL) 325 MG tablet Take 2 tablets (650 mg total) by mouth every 6 (six) hours as needed. 04/23/23  Yes Sloan Leiter, DO  acyclovir (ZOVIRAX) 800 MG tablet Take 0.5 tablets (400 mg total) by mouth 5 (five) times daily for 7 days. 04/23/23 04/30/23 Yes Tanda Rockers A, DO  pantoprazole (PROTONIX) 20 MG tablet Take 1 tablet (20 mg total) by mouth daily for 14 days. 04/23/23 05/07/23 Yes Tanda Rockers A, DO  amLODipine (NORVASC) 10 MG tablet Take 10 mg by mouth daily. 02/28/22   [provider]  docusate sodium (COLACE) 100 MG capsule daily as needed. 10/14/21   [provider]  escitalopram (LEXAPRO) 10 MG tablet Take 10 mg by mouth daily. 03/02/22   [provider]  famotidine (PEPCID) 20 MG tablet Take 20 mg by mouth daily.    [provider]  hydroxychloroquine (PLAQUENIL) 200 MG tablet Take 1 tablet (200 mg total) by mouth daily. 07/19/22   Rice, Jamesetta Orleans, MD  LORazepam (ATIVAN) 0.5 MG tablet Take 1 tablet (0.5 mg total) by mouth every 6 (six) hours as needed for anxiety. Patient taking differently: Take 0.5 mg by mouth daily. 06/16/17   Alwyn Ren, MD  meloxicam (MOBIC) 15 MG tablet Take 15 mg by mouth as needed. 09/29/21   [provider]  rosuvastatin (CRESTOR) 10 MG tablet Take 1 tablet (10 mg total) by mouth daily. Patient not taking:  Reported on 07/19/2022 06/10/22   Corky Crafts, MD  Simethicone 250 MG CAPS Take 250 mg by mouth as needed.    [provider]  tiZANidine (ZANAFLEX) 2 MG tablet 1-2 tablet as needed 01/15/21   [provider]  valsartan (DIOVAN) 320 MG tablet Take 320 mg by mouth daily. 03/05/22   [provider]                                                                                                                                    Past Surgical History Past Surgical History:  Procedure Laterality Date   ABDOMINAL HYSTERECTOMY  2000   CHOLECYSTECTOMY     Family  History Family History  Problem Relation Age of Onset   Cancer Mother    Breast cancer Mother    Hyperlipidemia Father    Hypertension Father    Atrial fibrillation Father    Deep vein thrombosis Sister    Cancer Other        Family History of Breast cancer   Healthy Daughter    Healthy Daughter    Healthy Daughter     Social History Social History   Tobacco Use   Smoking status: Former    Current packs/day: 0.00    Average packs/day: 0.3 packs/day for 1 year (0.3 ttl pk-yrs)    Types: Cigarettes    Start date: 10/19/1978    Quit date: 10/20/1979    Years since quitting: 43.5    Passive exposure: Past   Smokeless tobacco: Never  Vaping Use   Vaping status: Never Used  Substance Use Topics   Alcohol use: Yes    Alcohol/week: 3.0 standard drinks of alcohol    Types: 3 Glasses of wine per week   Drug use: No   Allergies Celecoxib, Hydrochlorothiazide, and Iodinated contrast media  Review of Systems Review of Systems  Constitutional:  Negative for chills and fever.  HENT:  Negative for facial swelling and trouble swallowing.   Eyes:  Negative for photophobia and visual disturbance.  Respiratory:  Negative for cough and shortness of breath.   Cardiovascular:  Negative for chest pain and palpitations.  Gastrointestinal:  Positive for abdominal pain. Negative for nausea and vomiting.  Endocrine: Negative for polydipsia and polyuria.  Genitourinary:  Negative for difficulty urinating and hematuria.  Musculoskeletal:  Positive for back pain. Negative for gait problem and joint swelling.  Skin:  Positive for rash. Negative for pallor.  Neurological:  Negative for syncope and headaches.  Psychiatric/Behavioral:  Negative for agitation and confusion.     Physical Exam Vital Signs  I have reviewed the triage vital signs BP 133/78 (BP Location: Right Arm)   Pulse 98   Temp 97.7 F (36.5 C) (Oral)   Resp 18   SpO2 97%  Physical Exam Vitals and nursing note reviewed.   Constitutional:      General: Alexandra Jordan is not in acute distress.  Appearance: Normal appearance. Alexandra Jordan is well-developed. Alexandra Jordan is not ill-appearing.  HENT:     Head: Normocephalic and atraumatic.     Right Ear: External ear normal.     Left Ear: External ear normal.     Nose: Nose normal.     Mouth/Throat:     Mouth: Mucous membranes are moist.  Eyes:     General: No scleral icterus.       Right eye: No discharge.        Left eye: No discharge.     Extraocular Movements: Extraocular movements intact.     Pupils: Pupils are equal, round, and reactive to light.  Cardiovascular:     Rate and Rhythm: Regular rhythm. Tachycardia present.     Pulses: Normal pulses.     Heart sounds: Normal heart sounds.  Pulmonary:     Effort: Pulmonary effort is normal. No respiratory distress.     Breath sounds: Normal breath sounds. No stridor.  Abdominal:     General: Abdomen is flat. There is no distension.     Palpations: Abdomen is soft.     Tenderness: There is no abdominal tenderness. There is no guarding or rebound.  Musculoskeletal:     Cervical back: No rigidity.     Right lower leg: No edema.     Left lower leg: No edema.  Skin:    General: Skin is warm and dry.     Capillary Refill: Capillary refill takes less than 2 seconds.       Neurological:     Mental Status: Alexandra Jordan is alert.  Psychiatric:        Mood and Affect: Mood normal.        Behavior: Behavior normal. Behavior is cooperative.     ED Results and Treatments Labs (all labs ordered are listed, but only abnormal results are displayed) Labs Reviewed  COMPREHENSIVE METABOLIC PANEL - Abnormal; Notable for the following components:      Result Value   Sodium 130 (*)    Chloride 96 (*)    Glucose, Bld 128 (*)    All other components within normal limits  CBC - Abnormal; Notable for the following components:   RBC 5.16 (*)    Hemoglobin 15.9 (*)    All other components within normal limits  URINALYSIS, ROUTINE W REFLEX  MICROSCOPIC - Abnormal; Notable for the following components:   Color, Urine STRAW (*)    Hgb urine dipstick SMALL (*)    All other components within normal limits  URINALYSIS, MICROSCOPIC (REFLEX) - Abnormal; Notable for the following components:   Bacteria, UA RARE (*)    All other components within normal limits  LIPASE, BLOOD                                                                                                                          Radiology CT ABDOMEN PELVIS W CONTRAST  Result Date: 04/23/2023 CLINICAL DATA:  Abdominal pain, acute, nonlocalized epig,  RLQ EXAM: CT ABDOMEN AND PELVIS WITH CONTRAST TECHNIQUE: Multidetector CT imaging of the abdomen and pelvis was performed using the standard protocol following bolus administration of intravenous contrast. RADIATION DOSE REDUCTION: This exam was performed according to the departmental dose-optimization program which includes automated exposure control, adjustment of the mA and/or kV according to patient size and/or use of iterative reconstruction technique. CONTRAST:  75mL OMNIPAQUE IOHEXOL 300 MG/ML  SOLN COMPARISON:  None 418 FINDINGS: Lower chest: Small hiatal hernia.  No acute abnormality Hepatobiliary: No focal liver abnormality is seen. Status post cholecystectomy. No biliary dilatation. Pancreas: No focal abnormality or ductal dilatation. Spleen: No focal abnormality.  Normal size. Adrenals/Urinary Tract: No adrenal abnormality. No focal renal abnormality. No stones or hydronephrosis. Urinary bladder is unremarkable. Stomach/Bowel: Normal appendix. Stomach, large and small bowel grossly unremarkable. Vascular/Lymphatic: Aortic atherosclerosis. No evidence of aneurysm or adenopathy. Reproductive: Prior hysterectomy.  No adnexal masses. Other: No free fluid or free air. Musculoskeletal: Scoliosis and degenerative changes in the lumbar spine. No acute bony abnormality. IMPRESSION: No acute findings in the abdomen or pelvis. Small  hiatal hernia. Aortic atherosclerosis. Electronically Signed   By: Charlett Nose M.D.   On: 04/23/2023 08:21    Pertinent labs & imaging results that were available during my care of the patient were reviewed by me and considered in my medical decision making (see MDM for details).  Medications Ordered in ED Medications  sodium chloride 0.9 % bolus 1,000 mL (0 mLs Intravenous Stopped 04/23/23 0906)  alum & mag hydroxide-simeth (MAALOX/MYLANTA) 200-200-20 MG/5ML suspension 30 mL (30 mLs Oral Given 04/23/23 0740)  iohexol (OMNIPAQUE) 300 MG/ML solution 75 mL (75 mLs Intravenous Contrast Given 04/23/23 0750)                                                                                                                                     Procedures Procedures  (including critical care time)  Medical Decision Making / ED Course    Medical Decision Making:    SEYMONE BLEW is a 67 y.o. female  with past medical history as below, significant for anxiety, HLD, HTN, RA, recurrent abd pain who presents to the ED with complaint of abd pain, rash. . The complaint involves an extensive differential diagnosis and also carries with it a high risk of complications and morbidity.  Serious etiology was considered. Ddx includes but is not limited to: Differential diagnosis includes but is not exclusive to acute cholecystitis, intrathoracic causes for epigastric abdominal pain, gastritis, duodenitis, pancreatitis, small bowel or large bowel obstruction, abdominal aortic aneurysm, hernia, gastritis, etc. Differential diagnosis includes but is not exclusive to ectopic pregnancy, ovarian cyst, ovarian torsion, acute appendicitis, urinary tract infection, endometriosis, bowel obstruction, hernia, colitis, renal colic, gastroenteritis, volvulus etc. Differential diagnosis includes but is not exclusive to musculoskeletal back pain, renal colic, urinary tract infection, pyelonephritis, intra-abdominal causes of back  pain, aortic aneurysm or dissection, cauda equina  syndrome, sciatica, lumbar disc disease, thoracic disc disease, etc. DDx for rash is broad and includes common causes of atopic dermatitis, contact dermatitis, drug eruption, erythema multiforme, fifth disease, psoriasis, insect bites, eczema, pityriasis rosea, roseola, scabies, tinea corporis, urticaria, varicella, viral exanthem. Uncommon causes of rash include bullous pemphigoid, dermatitis herpetiformis, HIV acute exanthem, Kawasaki disease, lupus, Lyme disease, meningococcemia, mycosis fungoides, RMSF, scarlet fever, secondary syphilis, SSSS, SJS, and toxic shock syndrome.   Complete initial physical exam performed, notably the patient  was no acute distress, sitting upright on stretcher, speaking clearly.    Reviewed and confirmed nursing documentation for past medical history, family history, social history.  Vital signs reviewed.    Clinical Course as of 04/23/23 1610  Wynelle Link Apr 23, 2023  0845 CTAP stable [SG]  0846 WBC, UA: NONE SEEN UTI unlikely  [SG]  0846 Bacteria, UA(!): RARE [SG]  0846 Sodium(!): 130 Give IVF [SG]    Clinical Course User Index [SG] Sloan Leiter, DO   Patient with myriad of complaints, will collect screening labs.  Rash on her abdomen appears consistent with shingles.  Onset around 3 to 4 days ago.  Rash appears isolated to the abdomen, does not include face or  Mucous membranes, not found on palms.  Will start on antiviral.  Alexandra Jordan is feeling much better on recheck.  Abdomen is soft,  nonperitoneal.  Breathing comfortably ambient air  Feeling better overall  Rash isolated to abdomen, does not include face/mouth/eyes/ears/neck  Alexandra Jordan has chronic recurrent abdominal pain, imaging and labs today are stable.  Give PPI, recommend bland diet, recommend alcohol cessation, f/u   The patient improved significantly and was discharged in stable condition. Detailed discussions were had with the patient regarding current  findings, and need for close f/u with PCP or on call doctor. The patient has been instructed to return immediately if the symptoms worsen in any way for re-evaluation. Patient verbalized understanding and is in agreement with current care plan. All questions answered prior to discharge.       Additional history obtained: -Additional history obtained from na -External records from outside source obtained and reviewed including: Chart review including previous notes, labs, imaging, consultation notes including primary care documentation, home meds   Lab Tests: -I ordered, reviewed, and interpreted labs.   The pertinent results include:   Labs Reviewed  COMPREHENSIVE METABOLIC PANEL - Abnormal; Notable for the following components:      Result Value   Sodium 130 (*)    Chloride 96 (*)    Glucose, Bld 128 (*)    All other components within normal limits  CBC - Abnormal; Notable for the following components:   RBC 5.16 (*)    Hemoglobin 15.9 (*)    All other components within normal limits  URINALYSIS, ROUTINE W REFLEX MICROSCOPIC - Abnormal; Notable for the following components:   Color, Urine STRAW (*)    Hgb urine dipstick SMALL (*)    All other components within normal limits  URINALYSIS, MICROSCOPIC (REFLEX) - Abnormal; Notable for the following components:   Bacteria, UA RARE (*)    All other components within normal limits  LIPASE, BLOOD    Notable for mild hyponatremia  EKG   EKG Interpretation Date/Time:    Ventricular Rate:    PR Interval:    QRS Duration:    QT Interval:    QTC Calculation:   R Axis:      Text Interpretation:  Imaging Studies ordered: I ordered imaging studies including CTAP I independently visualized the following imaging with scope of interpretation limited to determining acute life threatening conditions related to emergency care; findings noted above, significant for stable imaging  I independently visualized and interpreted  imaging. I agree with the radiologist interpretation   Medicines ordered and prescription drug management: Meds ordered this encounter  Medications   sodium chloride 0.9 % bolus 1,000 mL   alum & mag hydroxide-simeth (MAALOX/MYLANTA) 200-200-20 MG/5ML suspension 30 mL   iohexol (OMNIPAQUE) 300 MG/ML solution 75 mL   acyclovir (ZOVIRAX) 800 MG tablet    Sig: Take 0.5 tablets (400 mg total) by mouth 5 (five) times daily for 7 days.    Dispense:  18 tablet    Refill:  0   acetaminophen (TYLENOL) 325 MG tablet    Sig: Take 2 tablets (650 mg total) by mouth every 6 (six) hours as needed.    Dispense:  36 tablet    Refill:  0   pantoprazole (PROTONIX) 20 MG tablet    Sig: Take 1 tablet (20 mg total) by mouth daily for 14 days.    Dispense:  14 tablet    Refill:  0    -I have reviewed the patients home medicines and have made adjustments as needed   Consultations Obtained: na   Cardiac Monitoring: The patient was maintained on a cardiac monitor.  I personally viewed and interpreted the cardiac monitored which showed an underlying rhythm of: nsr  Social Determinants of Health:  Diagnosis or treatment significantly limited by social determinants of health: former smoker   Reevaluation: After the interventions noted above, I reevaluated the patient and found that they have improved  Co morbidities that complicate the patient evaluation  Past Medical History:  Diagnosis Date   Abnormal laboratory test    Abnormal white blood cell count    Acute thoracic back pain    Anemia    Anxiety    Constipation    ESR raised    Family history of breast cancer    GERD (gastroesophageal reflux disease)    Heart murmur    Hepatic steatosis    High cholesterol    HTN (hypertension)    Leukocytosis    OA (osteoarthritis)    Thoracic aorta injury    Intramural hematoma      Dispostion: Disposition decision including need for hospitalization was considered, and patient discharged  from emergency department.    Final Clinical Impression(s) / ED Diagnoses Final diagnoses:  Herpes zoster without complication  Generalized abdominal pain  Alcohol abuse  Hyponatremia     This chart was dictated using voice recognition software.  Despite best efforts to proofread,  errors can occur which can change the documentation meaning.    Sloan Leiter, DO 04/23/23 [phone]

## 2023-09-14 ENCOUNTER — Emergency Department (HOSPITAL_BASED_OUTPATIENT_CLINIC_OR_DEPARTMENT_OTHER): Payer: BC Managed Care – PPO

## 2023-09-14 ENCOUNTER — Emergency Department (HOSPITAL_BASED_OUTPATIENT_CLINIC_OR_DEPARTMENT_OTHER)
Admission: EM | Admit: 2023-09-14 | Discharge: 2023-09-14 | Disposition: A | Discharge status: Home / Self Care | Payer: BC Managed Care – PPO | Attending: Emergency Medicine | Admitting: Emergency Medicine

## 2023-09-14 ENCOUNTER — Encounter (HOSPITAL_BASED_OUTPATIENT_CLINIC_OR_DEPARTMENT_OTHER): Payer: Self-pay | Admitting: Emergency Medicine

## 2023-09-14 ENCOUNTER — Other Ambulatory Visit: Payer: Self-pay

## 2023-09-14 DIAGNOSIS — Z79899 Other long term (current) drug therapy: Secondary | ICD-10-CM | POA: Diagnosis not present

## 2023-09-14 DIAGNOSIS — I10 Essential (primary) hypertension: Secondary | ICD-10-CM | POA: Insufficient documentation

## 2023-09-14 DIAGNOSIS — R079 Chest pain, unspecified: Secondary | ICD-10-CM | POA: Diagnosis present

## 2023-09-14 LAB — CBC WITH DIFFERENTIAL/PLATELET
Abs Immature Granulocytes: 0.04 10*3/uL (ref 0.00–0.07)
Basophils Absolute: 0 10*3/uL (ref 0.0–0.1)
Basophils Relative: 0 %
Eosinophils Absolute: 0.1 10*3/uL (ref 0.0–0.5)
Eosinophils Relative: 1 %
HCT: 50.5 % — ABNORMAL HIGH (ref 36.0–46.0)
Hemoglobin: 16.9 g/dL — ABNORMAL HIGH (ref 12.0–15.0)
Immature Granulocytes: 0 %
Lymphocytes Relative: 13 %
Lymphs Abs: 1.4 10*3/uL (ref 0.7–4.0)
MCH: 32.3 pg (ref 26.0–34.0)
MCHC: 33.5 g/dL (ref 30.0–36.0)
MCV: 96.6 fL (ref 80.0–100.0)
Monocytes Absolute: 0.7 10*3/uL (ref 0.1–1.0)
Monocytes Relative: 7 %
Neutro Abs: 8.1 10*3/uL — ABNORMAL HIGH (ref 1.7–7.7)
Neutrophils Relative %: 79 %
Platelets: 248 10*3/uL (ref 150–400)
RBC: 5.23 MIL/uL — ABNORMAL HIGH (ref 3.87–5.11)
RDW: 14 % (ref 11.5–15.5)
WBC: 10.4 10*3/uL (ref 4.0–10.5)
nRBC: 0 % (ref 0.0–0.2)

## 2023-09-14 LAB — COMPREHENSIVE METABOLIC PANEL
ALT: 34 U/L (ref 0–44)
AST: 39 U/L (ref 15–41)
Albumin: 4 g/dL (ref 3.5–5.0)
Alkaline Phosphatase: 86 U/L (ref 38–126)
Anion gap: 11 (ref 5–15)
BUN: 8 mg/dL (ref 8–23)
CO2: 25 mmol/L (ref 22–32)
Calcium: 9 mg/dL (ref 8.9–10.3)
Chloride: 95 mmol/L — ABNORMAL LOW (ref 98–111)
Creatinine, Ser: 0.55 mg/dL (ref 0.44–1.00)
GFR, Estimated: >60 mL/min (ref >60)
Glucose, Bld: 135 mg/dL — ABNORMAL HIGH (ref 70–99)
Potassium: 3.1 mmol/L — ABNORMAL LOW (ref 3.5–5.1)
Sodium: 131 mmol/L — ABNORMAL LOW (ref 135–145)
Total Bilirubin: 1 mg/dL (ref <1.2)
Total Protein: 8.1 g/dL (ref 6.5–8.1)

## 2023-09-14 LAB — TROPONIN I (HIGH SENSITIVITY): Troponin I (High Sensitivity): 2 ng/L (ref <18)

## 2023-09-14 MED ORDER — POTASSIUM CHLORIDE 20 MEQ PO PACK
40.0000 meq | PACK | Freq: Two times a day (BID) | ORAL | Status: DC
  Administered 2023-09-14: 40 meq via ORAL
  Filled 2023-09-14: qty 2

## 2023-09-14 MED ORDER — LIDOCAINE VISCOUS HCL 2 % MT SOLN
15.0000 mL | Freq: Once | OROMUCOSAL | Status: AC
  Administered 2023-09-14: 15 mL via ORAL
  Filled 2023-09-14: qty 15

## 2023-09-14 MED ORDER — ALUM & MAG HYDROXIDE-SIMETH 200-200-20 MG/5ML PO SUSP
30.0000 mL | Freq: Once | ORAL | Status: AC
  Administered 2023-09-14: 30 mL via ORAL
  Filled 2023-09-14: qty 30

## 2023-09-14 MED ORDER — ASPIRIN 81 MG PO CHEW
243.0000 mg | CHEWABLE_TABLET | Freq: Once | ORAL | Status: AC
  Administered 2023-09-14: 243 mg via ORAL
  Filled 2023-09-14: qty 3

## 2023-09-14 NOTE — Discharge Instructions (Signed)
He was seen in the emerged part for chest pain Your EKG blood work and chest x-ray all looked okay We do not feel as though you are having a heart attack at this time The pain may be related to acid reflux/gastritis but we are not certain exactly what is causing the pain You should continue taking all previous prescribed medications Follow-up with your primary care doctor in 1 week for reevaluation Return to the emergency room for severe chest pain, trouble breathing or any other concerns

## 2023-09-14 NOTE — ED Provider Notes (Signed)
*  Duplicate*   Royanne Foots, DO 09/17/23 1827

## 2023-09-14 NOTE — ED Triage Notes (Signed)
Mid chest burning pain , Hx gerd , waked her up from sleep ' anxiety feeling also .  Denies shortness of breath or coughing .  Pain radiates to mid back , which she was seen for at PCP last week . Denies NV

## 2023-09-14 NOTE — ED Notes (Signed)

## 2023-09-14 NOTE — ED Provider Notes (Signed)
[facility] Provider Note   CSN: [id number] Arrival date & time: 09/14/23  1018     History  Chief Complaint  Patient presents with   Chest Pain    Alexandra Jordan is a 67 y.o. female.  With a history of hypertension presents to ED for chest pain.  Patient reports substernal chest pain with a burning quality that woke her from sleep around 0200.  Pain has persisted intermittently since the onset.  She took 181 mg aspirin prior to arrival.  No shortness of breath, nausea, vomiting, diaphoresis.  She notes that the pain radiates to the left shoulder which concerned her.  No other complaints at this time.  No prior history of MIs.   Chest Pain      Home Medications Prior to Admission medications   Medication Sig Start Date End Date Taking? Authorizing Provider  acetaminophen (TYLENOL) 325 MG tablet Take 2 tablets (650 mg total) by mouth every 6 (six) hours as needed. 04/23/23   Sloan Leiter, DO  amLODipine (NORVASC) 10 MG tablet Take 10 mg by mouth daily. 02/28/22   [provider]  docusate sodium (COLACE) 100 MG capsule daily as needed. 10/14/21   [provider]  escitalopram (LEXAPRO) 10 MG tablet Take 10 mg by mouth daily. 03/02/22   [provider]  famotidine (PEPCID) 20 MG tablet Take 20 mg by mouth daily.    [provider]  hydroxychloroquine (PLAQUENIL) 200 MG tablet Take 1 tablet (200 mg total) by mouth daily. 07/19/22   Rice, Jamesetta Orleans, MD  LORazepam (ATIVAN) 0.5 MG tablet Take 1 tablet (0.5 mg total) by mouth every 6 (six) hours as needed for anxiety. Patient taking differently: Take 0.5 mg by mouth daily. 06/16/17   Alwyn Ren, MD  meloxicam (MOBIC) 15 MG tablet Take 15 mg by mouth as needed. 09/29/21   [provider]  pantoprazole (PROTONIX) 20 MG tablet Take 1 tablet (20 mg total) by mouth daily for 14 days. 04/23/23 05/07/23  Tanda Rockers A, DO  rosuvastatin  (CRESTOR) 10 MG tablet Take 1 tablet (10 mg total) by mouth daily. Patient not taking: Reported on 07/19/2022 06/10/22   Corky Crafts, MD  Simethicone 250 MG CAPS Take 250 mg by mouth as needed.    [provider]  tiZANidine (ZANAFLEX) 2 MG tablet 1-2 tablet as needed 01/15/21   [provider]  valsartan (DIOVAN) 320 MG tablet Take 320 mg by mouth daily. 03/05/22   [provider]      Allergies    Celecoxib, Hydrochlorothiazide, and Iodinated contrast media    Review of Systems   Review of Systems  Cardiovascular:  Positive for chest pain.    Physical Exam Updated Vital Signs BP 131/79   Pulse 95   Temp 97.6 F (36.4 C)   Resp 12   Wt 57.2 kg   SpO2 100%   BMI 21.63 kg/m  Physical Exam Vitals and nursing note reviewed.  HENT:     Head: Normocephalic and atraumatic.  Eyes:     Pupils: Pupils are equal, round, and reactive to light.  Cardiovascular:     Rate and Rhythm: Normal rate and regular rhythm.  Pulmonary:     Effort: Pulmonary effort is normal.     Breath sounds: Normal breath sounds.  Abdominal:     Palpations: Abdomen is soft.     Tenderness: There is no abdominal tenderness.  Skin:  General: Skin is warm and dry.  Neurological:     Mental Status: She is alert.  Psychiatric:        Mood and Affect: Mood normal.     ED Results / Procedures / Treatments   Labs (all labs ordered are listed, but only abnormal results are displayed) Labs Reviewed  COMPREHENSIVE METABOLIC PANEL - Abnormal; Notable for the following components:      Result Value   Sodium 131 (*)    Potassium 3.1 (*)    Chloride 95 (*)    Glucose, Bld 135 (*)    All other components within normal limits  CBC WITH DIFFERENTIAL/PLATELET - Abnormal; Notable for the following components:   RBC 5.23 (*)    Hemoglobin 16.9 (*)    HCT 50.5 (*)    Neutro Abs 8.1 (*)    All other components within normal limits  TROPONIN I (HIGH SENSITIVITY)  TROPONIN I  (HIGH SENSITIVITY)    EKG EKG Interpretation Date/Time:  Thursday September 14 2023 10:29:35 EST Ventricular Rate:  93 PR Interval:  160 QRS Duration:  91 QT Interval:  356 QTC Calculation: 443 R Axis:   4  Text Interpretation: Sinus rhythm Confirmed by Estelle June [phone]) on 09/14/2023 12:22:15 PM  Radiology DG Chest Portable 1 View  Result Date: 09/14/2023 CLINICAL DATA:  Chest pain. EXAM: PORTABLE CHEST 1 VIEW COMPARISON:  January 08, 2013. FINDINGS: The heart size and mediastinal contours are within normal limits. Both lungs are clear. Dextroscoliosis of lower thoracic spine is noted. IMPRESSION: No active disease. Aortic Atherosclerosis (ICD10-I70.0). Electronically Signed   By: Lupita Raider M.D.   On: 09/14/2023 11:37    Procedures Procedures    Medications Ordered in ED Medications  potassium chloride (KLOR-CON) packet 40 mEq (has no administration in time range)  alum & mag hydroxide-simeth (MAALOX/MYLANTA) 200-200-20 MG/5ML suspension 30 mL (30 mLs Oral Given 09/14/23 1123)    And  lidocaine (XYLOCAINE) 2 % viscous mouth solution 15 mL (15 mLs Oral Given 09/14/23 1123)  aspirin chewable tablet 243 mg (243 mg Oral Given 09/14/23 1121)    ED Course/ Medical Decision Making/ A&P Clinical Course as of 09/14/23 1223  Thu Sep 14, 2023  1221 Laboratory workup notable for hypokalemia of 3.1.  Will provide oral repletion here.  Remainder of laboratory workup unremarkable.  High-sensitivity troponin of 2 not consistent with ACS.  EKG shows no ischemic changes or evidence of dysrhythmia.  Chest x-ray shows no acute disease.  Patient reports improvement in her discomfort.  She feels reassured that her cardiac enzyme is not elevated and will follow-up with her PCP.  Return precaution discussed in detail. [MP]    Clinical Course User Index [MP] Royanne Foots, DO                                 Medical Decision Making 67 year old female with history as above presenting for  chest pain that began last night.  Burning substernal chest pain with radiation to the left shoulder.  She does note the left shoulder pain has been episodic and preceded the chest pain.  No shortness of breath nausea vomiting or diaphoresis.  Hypertension but no other significant cardiac risk factors aside from age.  Comfortable appearing on exam.  Given that she can tolerate aspirin we will give her another 3X 81 mg chewable aspirin here given concern for potential ACS.  Will trial Maalox  and lidocaine as she has had recent issues with acid reflux and gastritis.  She is taking Protonix at home.  Differential diagnosis includes, ACS, dysrhythmia, pneumonia, and acid reflux.  No shortness of breath pleuritic chest pain or hypoxemia.  Low suspicion for venous thromboembolism.  Will obtain laboratory workup including metabolic panel, CBC and i-STAT of troponin along with EKG and chest x-ray  Amount and/or Complexity of Data Reviewed Labs: ordered. Radiology: ordered.  Risk OTC drugs. Prescription drug management.           Final Clinical Impression(s) / ED Diagnoses Final diagnoses:  Chest pain, unspecified type    Rx / DC Orders ED Discharge Orders     None         Royanne Foots, DO 09/14/23 1223

## 2024-05-29 NOTE — Progress Notes (Signed)
 Patient Name: Alexandra Jordan MR#: [id number] DOB: [birth year] Date: 05/29/2024 MED CHECK/new eval  05/29/2024 Beverely pleasant 68 year old married female with a history of depression and general anxiety She is experienced depression off-and-on for about 30 years, Has had trials of several agents previously initially had been on Zoloft and responded quite well to it but then gained about 20 or more pounds due to increased appetite She later came off of that was tried on Prozac and experienced some fatigue as well as another 20 pound weight gain Later came off of that it was tried the Lexapro once again experienced some fatigue and weight gain She admits that she just stays down much at a time she tends to worry excessively about every day things has had disrupted sleep, and then finds her self just not enjoying herself, feeling like she is in a hole and does not look forward to things does not seem to get excited about things she enjoys seeing her grandkids but it still just does not excite her like it would have previously She notes that the meds help me get out of the darkness, but once again should end up coming off of that in part due to fatigue but also due to the increased appetite and weight gain each time She is experienced these episodes off and on over the years tends to worry some about every day things may stay tense and on edge,  Also describes disrupted sleep In most evenings may drink a glass of wine with supper and then might have another 1 or 2 later in the evenings.  She has had some concern thinking she may need to cut back due to potential for long-term difficulties but she has also had a harder time with resting at night.  She is currently not on any antidepressant medicine but has been on a low-dose of lorazepam  0.5 mg up to twice daily which she has taken infrequently   Past medical history Patient Active Problem List  Diagnosis  . Primary osteoarthritis of right  knee  . Primary osteoarthritis of left knee  Undergoing evaluation for possible rheumatoid arthritis  Allergies  Allergen Reactions  . Celecoxib Hives    No past surgical history on file.   Current Outpatient Medications:  .  amLODIPine (NORVASC) 10 mg tablet, Take 1 tablet by mouth daily., Disp: , Rfl:  .  pantoprazole  (PROTONIX ) 40 mg EC tablet, , Disp: , Rfl:  .  valsartan (DIOVAN) 320 mg tablet, Take 1 tablet by mouth daily., Disp: , Rfl:  .  buPROPion (WELLBUTRIN XL) 150 mg 24 hr tablet, Take 1 tab daily for a week, then 2 tabs daily, Disp: 60 tablet, Rfl: 1 .  LORazepam  (ATIVAN ) 0.5 mg tablet, , Disp: , Rfl:  .  simethicone  (Phazyme) 250 mg cap, Take 250 mg by mouth., Disp: , Rfl:   Personal/social Married 3 daughters all in their 62s, and one of her daughters had some psychiatric issues had a possible eating disorder as well as OCD 5 grandchildren and oftentimes helps out with 2 of the grandchildren who live in close proximity  Only history is positive for anxiety and her one of her daughters as well as her father. History of OCD and her daughter as well.   MSE Appearance psych appearance: normal body habitus, appears stated age, casually dressed, and hygiene appropriate Behavior  cooperative, pleasant, and appropriate eye contact Motor normal gait and no tremors Speech normal rate, tone and rhythm Mood  euthymic Affect  Appropriate Thought process  Organized  Thought content denies suicidal/homicidal ideation and auditory/visual hallucinations Orientation   alert to person, alert to place, alert to time, and alert to situation Insight good  jmt good   Impression  Major depression recurrent mild Generalized anxiety disorder Insomnia  Overall she has a longstanding history of a number of episodes of depression find yourself feeling down with decreased energy fatigue disrupted sleep poor enjoyment of things and loss of interest in things she normally would  enjoy. Feeling somewhat helpless at those times She has responded well to several antidepressants a problem as they were all SSRIs and all contributed to weight gain when she took them  She also describes what sounds like generalized anxiety tending to overthink things worrying about every day things, and even as we began talking about the plan she wants to delay starting on something new because she was worried that she might potentially have an adverse side effect and she is planning to travel in a little bit over a week.  Added to that some concern about her alcohol use that she has anywhere from 1-3 drinks many evenings out of the week Am I suggest that she try cutting back a little at a time But we talked about some options for managing that  Strengths, good verbal skills good insight willingness to engage in treatment  Plan  Once again I think she is dealt with recurring depression as well as generalized anxiety. And then disrupted sleep at times Some concern that she may need to look at cutting back on alcohol use because she is using more than would be recommended  For starters though will give her a trial of Wellbutrin might consider an SNRI but the Wellbutrin would be low risk for fatigue also low risk for weight gain she could still take lorazepam  as needed for anxiety  Depending on response there we also could look at a trial of something such as ReVia or topiramate to reduce alcohol craving  Will start Wellbutrin XL 150 mg daily for a week then 300 mg daily, she like to wait till she returns from her trip which would be about 2 weeks from now before starting on the Wellbutrin in case she has some type of reaction she did not want to risk that especially with her being away from home for period of a week or more  Continue lorazepam  0.5 mg twice daily which she takes infrequently but could renew this if needed  I once again will consider something such as reviewed or possibly  topiramate  Reviewed potential side effects of the medicine had a recent interim,  Collateral was obtained from discussion with patient as well as review of EMR.  Orders Placed This Encounter  Medications  . buPROPion (WELLBUTRIN XL) 150 mg 24 hr tablet    Sig: Take 1 tab daily for a week, then 2 tabs daily    Dispense:  60 tablet    Refill:  1

## 2024-07-01 ENCOUNTER — Telehealth: Payer: Self-pay | Admitting: Internal Medicine

## 2024-07-01 NOTE — Telephone Encounter (Signed)
 I called patient, patient hasn't been seen since 07/25/2022 and will need to schedule an appt. Patient needs letter by 07/25/2022. Patient will call PCP. If patient cannot get letter from PCP, patient will call back to schedule appt. Patient does not want to start meds.

## 2024-07-01 NOTE — Telephone Encounter (Signed)
 Patient called requesting an accommodation letter for the [facility] which states she has RA and it is difficult to stand for extended periods.  Patient states she has to renew her driver's license in October and was told if she brought in a letter they would provide a chair.  Patient requested a return call when the letter was ready and she would stop by the office to pick it up.
